# Patient Record
Sex: Female | Born: 1980 | Race: Black or African American | Hispanic: No | Marital: Single | Smoking: Current every day smoker
Health system: Southern US, Community
[De-identification: ages and names within clinical notes are randomized; demographics above are authoritative.]

## PROBLEM LIST (undated history)

## (undated) DIAGNOSIS — F419 Anxiety disorder, unspecified: Secondary | ICD-10-CM

## (undated) HISTORY — PX: KNEE SURGERY: SHX244

## (undated) HISTORY — DX: Anxiety disorder, unspecified: F41.9

---
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
## 2003-01-24 ENCOUNTER — Encounter: Payer: Self-pay | Admitting: Obstetrics & Gynecology

## 2003-01-24 ENCOUNTER — Encounter: Admission: RE | Admit: 2003-01-24 | Discharge: 2003-01-24 | Payer: Self-pay | Admitting: Obstetrics & Gynecology

## 2005-01-25 ENCOUNTER — Emergency Department (HOSPITAL_COMMUNITY): Admission: EM | Admit: 2005-01-25 | Discharge: 2005-01-25 | Payer: Self-pay | Admitting: Emergency Medicine

## 2005-02-19 ENCOUNTER — Emergency Department (HOSPITAL_COMMUNITY): Admission: EM | Admit: 2005-02-19 | Discharge: 2005-02-19 | Payer: Self-pay | Admitting: Emergency Medicine

## 2005-08-04 ENCOUNTER — Emergency Department (HOSPITAL_COMMUNITY): Admission: EM | Admit: 2005-08-04 | Discharge: 2005-08-04 | Payer: Self-pay | Admitting: Emergency Medicine

## 2006-02-16 ENCOUNTER — Emergency Department (HOSPITAL_COMMUNITY): Admission: EM | Admit: 2006-02-16 | Discharge: 2006-02-16 | Payer: Self-pay | Admitting: Emergency Medicine

## 2006-11-28 ENCOUNTER — Emergency Department (HOSPITAL_COMMUNITY): Admission: EM | Admit: 2006-11-28 | Discharge: 2006-11-28 | Payer: Self-pay | Admitting: Emergency Medicine

## 2006-12-15 ENCOUNTER — Emergency Department (HOSPITAL_COMMUNITY): Admission: EM | Admit: 2006-12-15 | Discharge: 2006-12-15 | Payer: Self-pay | Admitting: Emergency Medicine

## 2007-10-15 ENCOUNTER — Emergency Department (HOSPITAL_COMMUNITY): Admission: EM | Admit: 2007-10-15 | Discharge: 2007-10-15 | Payer: Self-pay | Admitting: Emergency Medicine

## 2008-06-26 IMAGING — CR DG RIBS W/ CHEST 3+V*R*
3 series · 3 of 3 positions shown · non-contrast
Comparison: Chest x-ray 02/16/06.

CLINICAL DATA: Pain posterior right lower ribs ? no known injury.
 CHEST WITH RIGHT RIBS ? 3 VIEW:

[view not recorded (1 of 3)]
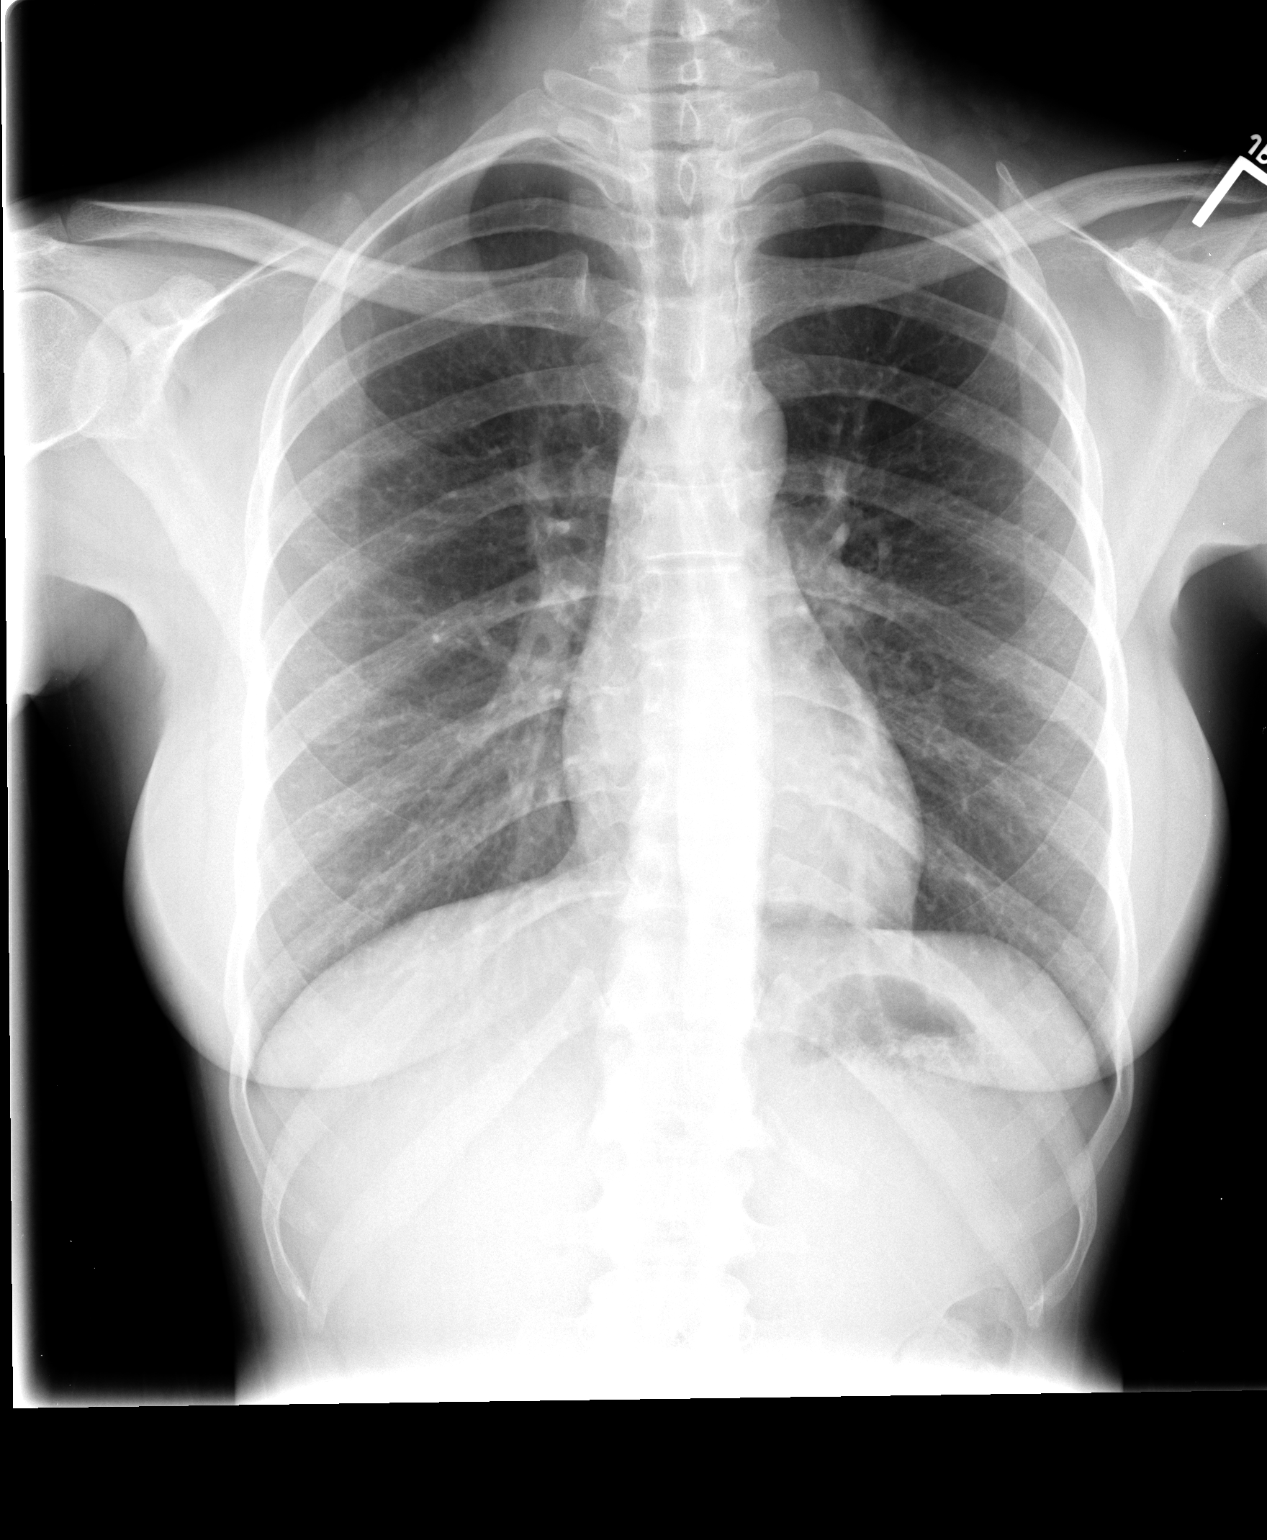

[view not recorded (2 of 3)]
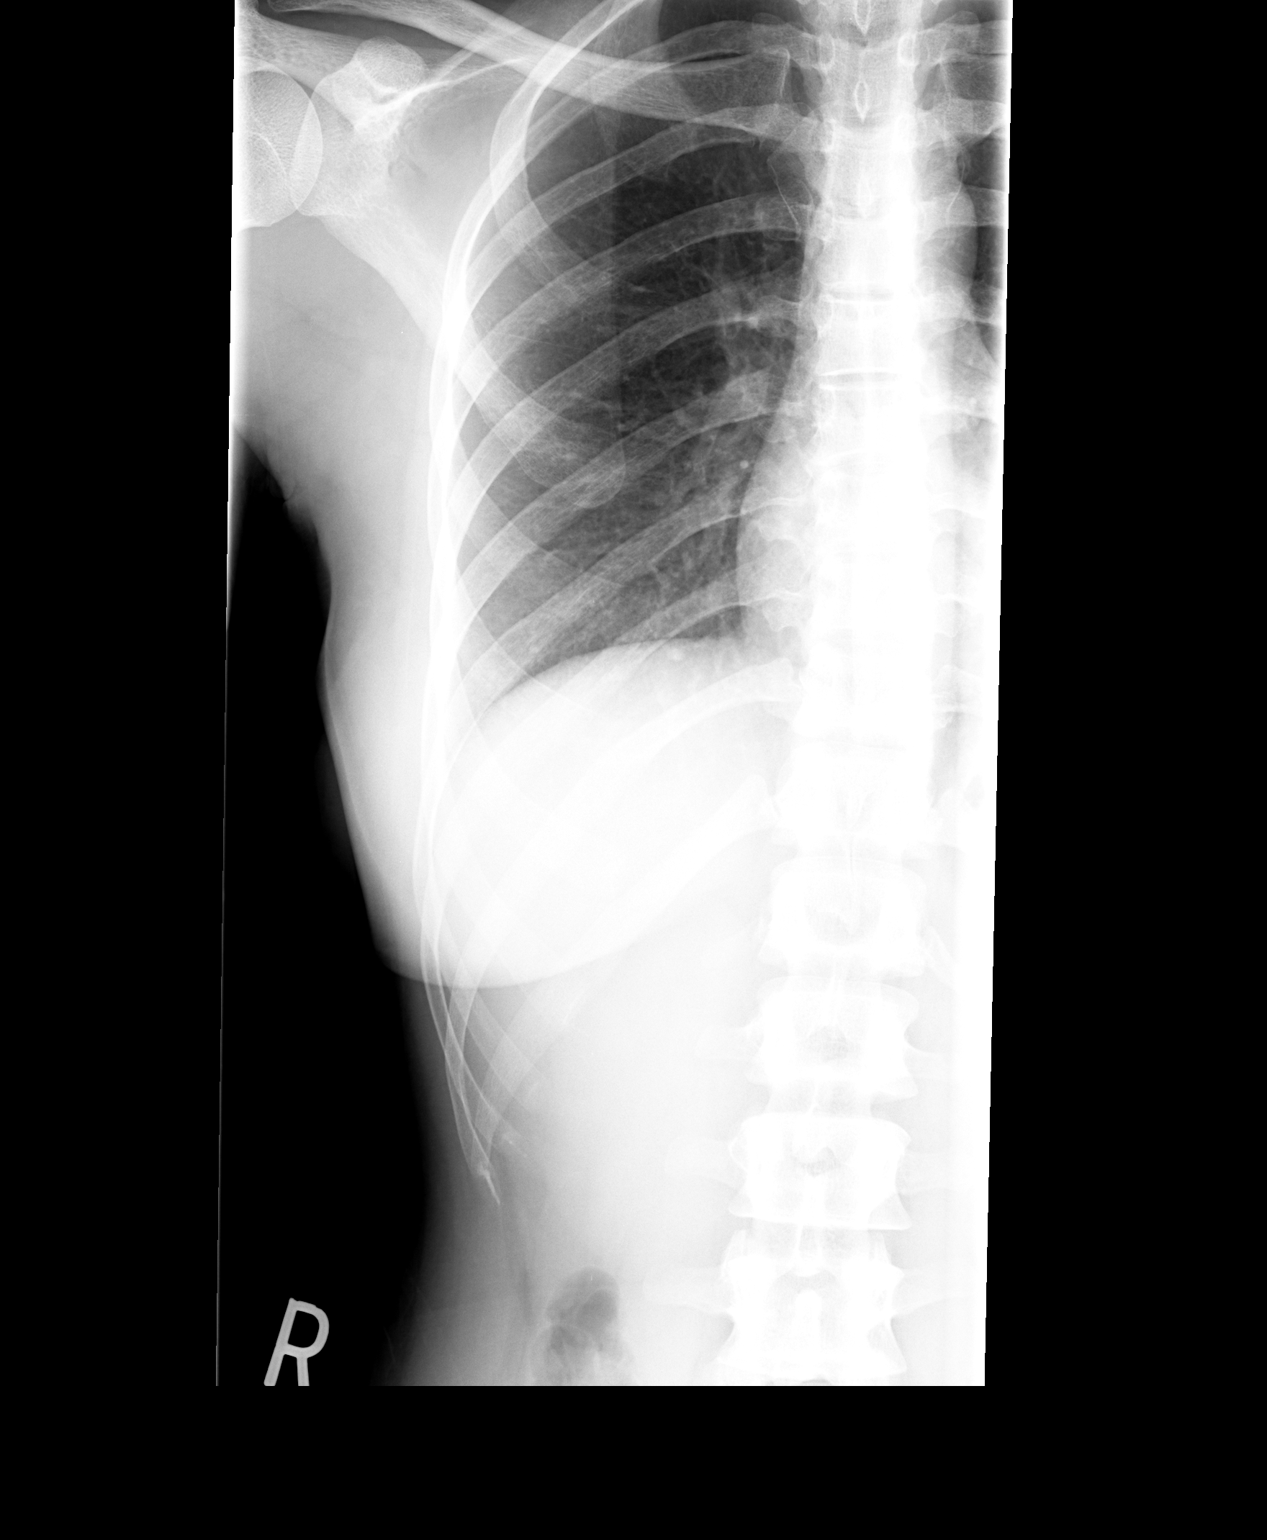

[view not recorded (3 of 3)]
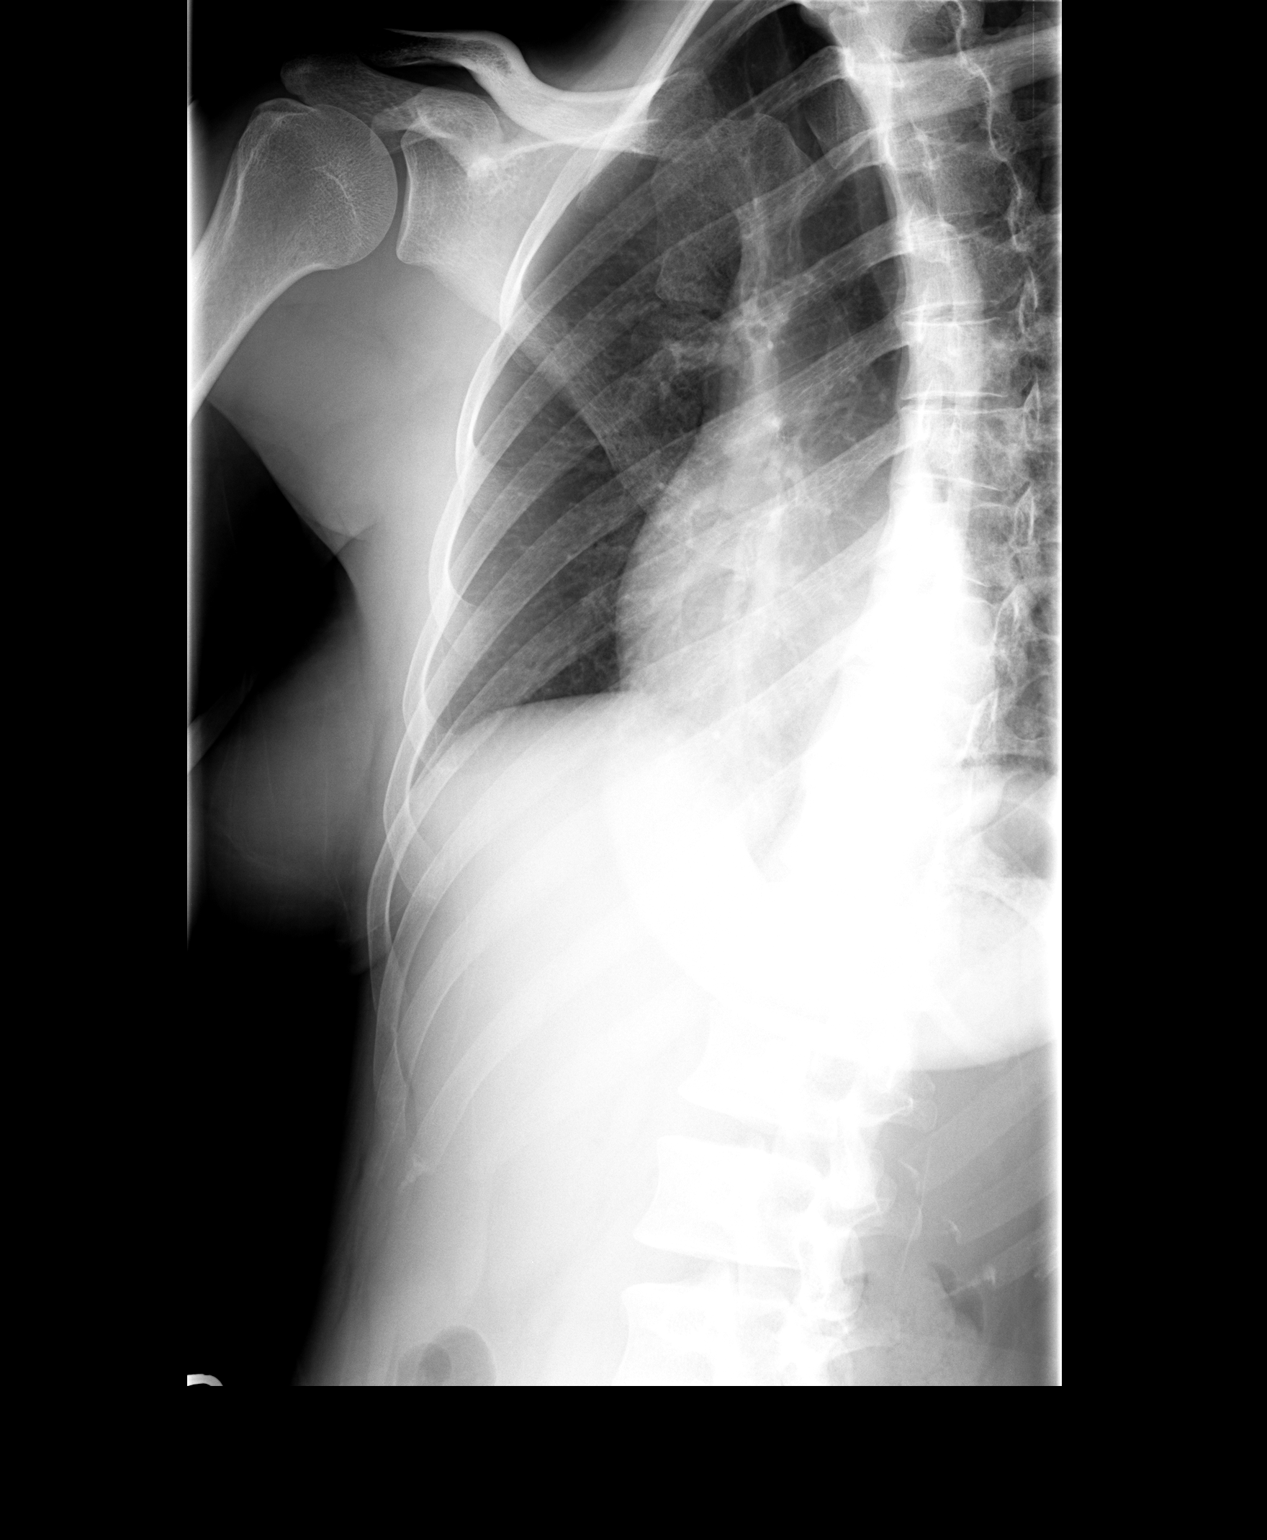

[3 of 3 positions shown; findings below may reference images not displayed]

FINDINGS: No rib fractures. No pneumothorax. No active cardiopulmonary disease.
IMPRESSION: No acute or significant findings.

## 2008-07-22 ENCOUNTER — Emergency Department (HOSPITAL_COMMUNITY): Admission: EM | Admit: 2008-07-22 | Discharge: 2008-07-22 | Payer: Self-pay | Admitting: Emergency Medicine

## 2009-08-12 ENCOUNTER — Emergency Department (HOSPITAL_COMMUNITY): Admission: EM | Admit: 2009-08-12 | Discharge: 2009-08-12 | Payer: Self-pay | Admitting: Emergency Medicine

## 2011-03-10 ENCOUNTER — Emergency Department (HOSPITAL_COMMUNITY)
Admission: EM | Admit: 2011-03-10 | Discharge: 2011-03-10 | Disposition: A | Discharge status: Home / Self Care | Payer: Self-pay | Attending: Emergency Medicine | Admitting: Emergency Medicine

## 2011-03-10 DIAGNOSIS — R21 Rash and other nonspecific skin eruption: Secondary | ICD-10-CM | POA: Insufficient documentation

## 2011-11-26 ENCOUNTER — Encounter (HOSPITAL_COMMUNITY): Payer: Self-pay | Admitting: Emergency Medicine

## 2011-11-26 ENCOUNTER — Emergency Department (HOSPITAL_COMMUNITY)
Admission: EM | Admit: 2011-11-26 | Discharge: 2011-11-26 | Disposition: A | Discharge status: Home / Self Care | Payer: Self-pay | Attending: Emergency Medicine | Admitting: Emergency Medicine

## 2011-11-26 DIAGNOSIS — K089 Disorder of teeth and supporting structures, unspecified: Secondary | ICD-10-CM | POA: Insufficient documentation

## 2011-11-26 DIAGNOSIS — K0889 Other specified disorders of teeth and supporting structures: Secondary | ICD-10-CM

## 2011-11-26 DIAGNOSIS — J45909 Unspecified asthma, uncomplicated: Secondary | ICD-10-CM | POA: Insufficient documentation

## 2011-11-26 DIAGNOSIS — Z79899 Other long term (current) drug therapy: Secondary | ICD-10-CM | POA: Insufficient documentation

## 2011-11-26 DIAGNOSIS — R220 Localized swelling, mass and lump, head: Secondary | ICD-10-CM | POA: Insufficient documentation

## 2011-11-26 DIAGNOSIS — F17200 Nicotine dependence, unspecified, uncomplicated: Secondary | ICD-10-CM | POA: Insufficient documentation

## 2011-11-26 DIAGNOSIS — R221 Localized swelling, mass and lump, neck: Secondary | ICD-10-CM | POA: Insufficient documentation

## 2011-11-26 MED ORDER — PENICILLIN V POTASSIUM 500 MG PO TABS: 500.0000 mg | ORAL_TABLET | Freq: Four times a day (QID) | ORAL | Status: AC

## 2011-11-26 MED ORDER — OXYCODONE-ACETAMINOPHEN 5-325 MG PO TABS: 1.0000 | ORAL_TABLET | Freq: Four times a day (QID) | ORAL | Status: AC | PRN

## 2011-11-26 NOTE — ED Notes (Signed)
Pt c/o wisdom tooth pain x 1 week. Pt reports all 4 wisdom teeth seem to be coming in.

## 2011-11-26 NOTE — ED Provider Notes (Signed)
History     CSN: [id number]  Arrival date & time 11/26/11  0865   First MD Initiated Contact with Patient 11/26/11 [phone]      Chief Complaint  Patient presents with  . Dental Pain    (Consider location/radiation/quality/duration/timing/severity/associated sxs/prior treatment) HPI Comments: The patient reports that she has had right lower wisdom tooth pain for the past 1.5 weeks, which is becoming progressively worse.  She reports that she has been feeling the tooth come through her gum.  She has taken Ibuprofen and Advil for the pain, which she reports has only had minimal relief.  She does not have a dentist.    Patient is a 31 y.o. female presenting with tooth pain. The history is provided by the patient.  Dental PainPrimary symptoms do not include dental injury, headaches or fever.  Additional symptoms include: gum swelling and gum tenderness. Additional symptoms do not include: dental sensitivity to temperature, purulent gums, trismus, facial swelling, trouble swallowing, pain with swallowing and drooling.    Past Medical History  Diagnosis Date  . Asthma     Past Surgical History  Procedure Date  . Knee surgery     History reviewed. No pertinent family history.  History  Substance Use Topics  . Smoking status: Current Everyday Smoker  . Smokeless tobacco: Not on file  . Alcohol Use: Yes    OB History    Grav Para Term Preterm Abortions TAB SAB Ect Mult Living                  Review of Systems  Constitutional: Negative for fever and chills.  HENT: Negative for facial swelling, drooling, trouble swallowing, neck pain, neck stiffness and voice change.   Gastrointestinal: Negative for vomiting.  Neurological: Negative for headaches.    Allergies  Darvocet  Home Medications   Current Outpatient Rx  Name Route Sig Dispense Refill  . CYCLOBENZAPRINE HCL 5 MG PO TABS Oral Take 5 mg by mouth 3 (three) times daily as needed.    . IBUPROFEN 200 MG PO TABS Oral  Take 200 mg by mouth every 6 (six) hours as needed. For pain.      BP 112/79  Pulse 72  Temp(Src) 98.8 F (37.1 C) (Oral)  Resp 18  SpO2 100%  LMP 11/03/2011  Physical Exam  Nursing note and vitals reviewed. Constitutional: She is oriented to person, place, and time. She appears well-developed and well-nourished. No distress.  HENT:  Head: Normocephalic and atraumatic. No trismus in the jaw.  Mouth/Throat: Uvula is midline, oropharynx is clear and moist and mucous membranes are normal. Abnormal dentition. No dental abscesses or uvula swelling. No oropharyngeal exudate, posterior oropharyngeal edema, posterior oropharyngeal erythema or tonsillar abscesses.       Right lower wisdom tooth beginning to protrude through the gum. Pt able to open and close mouth with out difficulty. Airway intact. Uvula midline. Mild gingival swelling with tenderness over affected area, but no fluctuance. No swelling or tenderness of submental and submandibular regions.  Eyes: Conjunctivae and EOM are normal.  Neck: Normal range of motion and full passive range of motion without pain. Neck supple.  Cardiovascular: Normal rate and regular rhythm.   Pulmonary/Chest: Effort normal and breath sounds normal. No stridor. No respiratory distress. She has no wheezes.  Musculoskeletal: Normal range of motion.  Lymphadenopathy:       Head (right side): No submental, no submandibular, no tonsillar, no preauricular and no posterior auricular adenopathy present.  Head (left side): No submental, no submandibular, no tonsillar, no preauricular and no posterior auricular adenopathy present.    She has no cervical adenopathy.  Neurological: She is alert and oriented to person, place, and time.  Skin: Skin is warm and dry. No rash noted. She is not diaphoretic.    ED Course  Procedures (including critical care time)  Labs Reviewed - No data to display No results found.   No diagnosis found.    MDM  Patient  with toothache.  No gross abscess.  Wisdom tooth coming through the gum.   Exam unconcerning for Ludwig's angina or spread of infection.  Will treat with penicillin and pain medicine.  Urged patient to follow-up with oral surgery         Magnus Sinning, PA-C 11/27/11 2335

## 2011-11-26 NOTE — Discharge Instructions (Signed)
You have a dental pain. Use the resource guide listed below to help you find a dentist if you do not already have one to followup with. It is very important that you get evaluated by a dentist as soon as possible. Call tomorrow to schedule an appointment. Use your pain medication as prescribed and do not operate heavy machinery while on pain medication. Note that your pain medication contains acetaminophen (Tylenol) & its is not reccommended that you use additional acetaminophen (Tylenol) while taking this medication. Take your full course of antibiotics. Read the instructions below. ° °Eat a soft or liquid diet and rinse your mouth out after meals with warm water. You should see a dentist or return here at once if you have increased swelling, increased pain or uncontrolled bleeding from the site of your injury. ° ° °SEEK MEDICAL CARE IF:  °· You have increased pain not controlled with medicines.  °· You have swelling around your tooth, in your face or neck.  °· You have bleeding which starts, continues, or gets worse.  °· You have a fever >101 °· If you are unable to open your mouth ° °RESOURCE GUIDE ° °Dental Problems ° °Patients with Medicaid: °[facility]                     [facility] °[address]                                           [address] °Phone:  [phone]                                                  Phone:  [phone] ° °If unable to pay or uninsured, contact:  [facility] or [facility] to become qualified for the adult dental clinic. ° °Chronic Pain Problems °Contact [facility]  [phone] °Patients need to be referred by their primary care doctor. ° °Insufficient Money for Medicine °Contact [employer] Way:  call "211" or [facility] 271-5999. ° °No Primary Care Doctor °Call [facility]  832-8000 °Other agencies that provide inexpensive medical care °   [facility]  [phone] °   [facility]  [phone] °   [facility]  [phone] °   [facility] Clinic  832-4777 °   [facility]  [phone] °   [facility]  [phone] ° °Psychological Services °[facility]  [phone] °[facility]  [phone] °[facility]   800 853-5163 (emergency services 641-4993) ° °Substance Abuse Resources °[facility]  336-882-2125 °[facility] 336-784-9470 °[facility] 336-285-9073 °[facility] 336-845-3988 °Residential & Outpatient Substance Abuse Program  800-659-3381 ° °Abuse/Neglect °[place] [facility] (336) 641-3795 °[place] [facility] Hotline 800-378-5315 (After Hours) ° °Emergency Shelter °[facility] (336) 271-5985 ° °Maternity Homes °[facility] (336) 275-9566 °[facility] (704) 372-4663 ° °MRSA Hotline #:   832-7006 ° ° ° °[place] Resources ° °[facility] [place]     [employer]                            [facility] [address] [place]                       [address]      [address]  [place]                                                [place]                            [place] Phone:  [phone]                                   Phone:  [phone]                 Phone:  [phone]  [facility] Phone:  [phone]  [place] Child Abuse Hotline (336) 342-1394 (336) 342-3537 (After Hours)    

## 2011-11-29 NOTE — ED Provider Notes (Signed)
Medical screening examination/treatment/procedure(s) were performed by non-physician practitioner and as supervising physician I was immediately available for consultation/collaboration.   Issiah Huffaker, MD 11/29/11 2321 

## 2011-12-23 ENCOUNTER — Emergency Department (HOSPITAL_COMMUNITY)
Admission: EM | Admit: 2011-12-23 | Discharge: 2011-12-23 | Disposition: A | Discharge status: Home / Self Care | Payer: Self-pay | Attending: Emergency Medicine | Admitting: Emergency Medicine

## 2011-12-23 ENCOUNTER — Encounter (HOSPITAL_COMMUNITY): Payer: Self-pay | Admitting: *Deleted

## 2011-12-23 DIAGNOSIS — F17200 Nicotine dependence, unspecified, uncomplicated: Secondary | ICD-10-CM | POA: Insufficient documentation

## 2011-12-23 DIAGNOSIS — J45909 Unspecified asthma, uncomplicated: Secondary | ICD-10-CM | POA: Insufficient documentation

## 2011-12-23 DIAGNOSIS — K0889 Other specified disorders of teeth and supporting structures: Secondary | ICD-10-CM

## 2011-12-23 DIAGNOSIS — K089 Disorder of teeth and supporting structures, unspecified: Secondary | ICD-10-CM | POA: Insufficient documentation

## 2011-12-23 DIAGNOSIS — K029 Dental caries, unspecified: Secondary | ICD-10-CM | POA: Insufficient documentation

## 2011-12-23 MED ORDER — PENICILLIN V POTASSIUM 500 MG PO TABS: 500.0000 mg | ORAL_TABLET | Freq: Three times a day (TID) | ORAL | Status: AC

## 2011-12-23 MED ORDER — OXYCODONE-ACETAMINOPHEN 5-325 MG PO TABS
1.0000 | ORAL_TABLET | Freq: Once | ORAL | Status: AC
  Administered 2011-12-23: 1 via ORAL
  Filled 2011-12-23: qty 1

## 2011-12-23 MED ORDER — OXYCODONE-ACETAMINOPHEN 5-325 MG PO TABS: 1.0000 | ORAL_TABLET | Freq: Four times a day (QID) | ORAL | Status: AC | PRN

## 2011-12-23 NOTE — ED Provider Notes (Signed)
History     CSN: [id number]  Arrival date & time 12/23/11  2051   First MD Initiated Contact with Patient 12/23/11 2239      Chief Complaint  Patient presents with  . Dental Pain    (Consider location/radiation/quality/duration/timing/severity/associated sxs/prior treatment) HPI  Patient presents to the emergency department with a dental complaint. Symptoms began 3 days ago. The patient has tried to alleviate pain with Ibuprofen.  Pain rated at a 10/10, characterized as throbbing in nature and located left lower molar. Patient denies fever, night sweats, chills, difficulty swallowing or opening mouth, SOB, nuchal rigidity or decreased ROM of neck.  Patient does not have a dentist and requests a resource guide at discharge.   Past Medical History  Diagnosis Date  . Asthma     Past Surgical History  Procedure Date  . Knee surgery     History reviewed. No pertinent family history.  History  Substance Use Topics  . Smoking status: Current Everyday Smoker  . Smokeless tobacco: Not on file  . Alcohol Use: Yes    OB History    Grav Para Term Preterm Abortions TAB SAB Ect Mult Living                  Review of Systems   HEENT: denies blurry vision or change in hearing PULMONARY: Denies difficulty breathing and SOB CARDIAC: denies chest pain or heart palpitations MUSCULOSKELETAL:  denies being unable to ambulate ABDOMEN AL: denies abdominal pain GU: denies loss of bowel or urinary control NEURO: denies numbness and tingling in extremities   Allergies  Darvocet  Home Medications   Current Outpatient Rx  Name Route Sig Dispense Refill  . IBUPROFEN 200 MG PO TABS Oral Take 200 mg by mouth every 6 (six) hours as needed. For pain.    . OXYCODONE-ACETAMINOPHEN 5-325 MG PO TABS Oral Take 1 tablet by mouth every 6 (six) hours as needed for pain. 10 tablet 0  . PENICILLIN V POTASSIUM 500 MG PO TABS Oral Take 1 tablet (500 mg total) by mouth 3 (three) times daily. 30  tablet 0    BP 122/83  Pulse 69  Temp(Src) 98.3 F (36.8 C) (Oral)  Resp 18  SpO2 99%  LMP 11/29/2011  Physical Exam  Constitutional: She appears well-developed and well-nourished. No distress.  HENT:  Head: Normocephalic and atraumatic. No trismus in the jaw.  Mouth/Throat: Uvula is midline, oropharynx is clear and moist and mucous membranes are normal. Normal dentition. Dental caries (Pts tooth shows no obvious abscess but moderate to severe tenderness to palpation of marked tooth) present. No uvula swelling.    Eyes: Pupils are equal, round, and reactive to light.  Neck: Trachea normal, normal range of motion and full passive range of motion without pain. Neck supple.  Cardiovascular: Normal rate, regular rhythm, normal heart sounds and normal pulses.   Pulmonary/Chest: Effort normal and breath sounds normal. No respiratory distress. Chest wall is not dull to percussion. She exhibits no tenderness, no crepitus, no edema, no deformity and no retraction.  Abdominal: Normal appearance.  Musculoskeletal: Normal range of motion.  Neurological: She is alert. She has normal strength.  Skin: Skin is warm, dry and intact. She is not diaphoretic.  Psychiatric: She has a normal mood and affect. Her speech is normal. Cognition and memory are normal.    ED Course  Procedures (including critical care time)  Labs Reviewed - No data to display No results found.   1. Pain, dental  MDM  Pt given Rx for Percocets 5-325 (10 tabs) and Penicillin. Patient informed that they need to find a dentist and have the tooth pulled or the symptoms may be reoccurring. A Resource guide has been given with dental providers. Patient has been given return to ED precautions.         Dorthula Matas, PA 12/24/11 2237

## 2011-12-23 NOTE — ED Notes (Addendum)
C/o pain r/t wisdom teeth coming in, was here last month for the same, reoccurred 3d ago, comes and goes, pinpoints pain to L upper and lower jaw. Mentions low grade fever and some drainange (Denies: nvd). Last took advil at 1830, no relief.

## 2011-12-23 NOTE — Discharge Instructions (Signed)
Dental Pain A tooth ache may be caused by cavities (tooth decay). Cavities expose the nerve of the tooth to air and hot or cold temperatures. It may come from an infection or abscess (also called a boil or furuncle) around your tooth. It is also often caused by dental caries (tooth decay). This causes the pain you are having. DIAGNOSIS  Your caregiver can diagnose this problem by exam. TREATMENT   If caused by an infection, it may be treated with medications which kill germs (antibiotics) and pain medications as prescribed by your caregiver. Take medications as directed.   Only take over-the-counter or prescription medicines for pain, discomfort, or fever as directed by your caregiver.   Whether the tooth ache today is caused by infection or dental disease, you should see your dentist as soon as possible for further care.  SEEK MEDICAL CARE IF: The exam and treatment you received today has been provided on an emergency basis only. This is not a substitute for complete medical or dental care. If your problem worsens or new problems (symptoms) appear, and you are unable to meet with your dentist, call or return to this location. SEEK IMMEDIATE MEDICAL CARE IF:   You have a fever.   You develop redness and swelling of your face, jaw, or neck.   You are unable to open your mouth.   You have severe pain uncontrolled by pain medicine.  MAKE SURE YOU:   Understand these instructions.   Will watch your condition.   Will get help right away if you are not doing well or get worse.  Document Released: 07/25/2005 Document Revised: 07/14/2011 Document Reviewed: 03/12/2008 Exit[employer] Patient Information 2012 [employer]  RESOURCE GUIDE  Dental Problems  Patients with Medicaid: [facility]                     [facility] [address]                                           [address] Phone:  [phone]                                                   Phone:  [phone]  If unable to pay or uninsured, contact:  [facility] or [facility] to become qualified for the adult dental clinic.  Chronic Pain Problems Contact [facility]  [phone] Patients need to be referred by their primary care doctor.  Insufficient Money for Medicine Contact [employer] Way:  call "211" or [facility] 271-5999.  No Primary Care Doctor Call [facility]  832-8000 Other agencies that provide inexpensive medical care    [facility]  832-8035    [facility]  832-7272    [facility]  [phone]    [facility] Clinic  832-4777    [facility]  [phone]    [facility]  [phone]  Psychological Services [facility]  832-9600 [facility]  [phone] [facility]   800 853-5163 (emergency services 641-4993)  Substance Abuse Resources [facility]  336-882-2125 [facility] 336-784-9470 [facility] 336-285-9073 [facility]   336-845-3988 Residential & Outpatient Substance Abuse Program  800-659-3381  Abuse/Neglect [place] Child [facility] Hotline (336) 641-3795 [place] [facility] Hotline 800-378-5315 (After Hours)  Emergency Shelter [facility] (336) 271-5985  Maternity Homes [facility] (336) 275-9566 [facility] (704) 372-4663  MRSA Hotline #:   832-7006    [place] Resources  [facility] [place]     [employer]                          [facility] [address] [place]                       [address]      [address]  [place]                                                [place]                            [place] Phone:  [phone]                                   Phone:  [phone]                 Phone:  [phone]  [facility] Phone:   [phone]  [place] Child Abuse Hotline (336) 342-1394 (336) 342-3537 (After Hours)   

## 2011-12-25 NOTE — ED Provider Notes (Signed)
Medical screening examination/treatment/procedure(s) were performed by non-physician practitioner and as supervising physician I was immediately available for consultation/collaboration.   Gerhard Munch, MD 12/25/11 1436

## 2012-11-23 ENCOUNTER — Emergency Department (HOSPITAL_COMMUNITY)
Admission: EM | Admit: 2012-11-23 | Discharge: 2012-11-23 | Disposition: A | Discharge status: Home / Self Care | Payer: 59 | Attending: Emergency Medicine | Admitting: Emergency Medicine

## 2012-11-23 ENCOUNTER — Encounter (HOSPITAL_COMMUNITY): Payer: Self-pay | Admitting: Emergency Medicine

## 2012-11-23 DIAGNOSIS — L299 Pruritus, unspecified: Secondary | ICD-10-CM | POA: Insufficient documentation

## 2012-11-23 DIAGNOSIS — F17200 Nicotine dependence, unspecified, uncomplicated: Secondary | ICD-10-CM | POA: Insufficient documentation

## 2012-11-23 DIAGNOSIS — J45909 Unspecified asthma, uncomplicated: Secondary | ICD-10-CM | POA: Insufficient documentation

## 2012-11-23 DIAGNOSIS — R21 Rash and other nonspecific skin eruption: Secondary | ICD-10-CM

## 2012-11-23 DIAGNOSIS — Z79899 Other long term (current) drug therapy: Secondary | ICD-10-CM | POA: Insufficient documentation

## 2012-11-23 MED ORDER — PERMETHRIN 5 % EX CREA: TOPICAL_CREAM | CUTANEOUS | Status: AC

## 2012-11-23 MED ORDER — HYDROCORTISONE 1 % EX CREA: TOPICAL_CREAM | Freq: Two times a day (BID) | CUTANEOUS | Status: DC

## 2012-11-23 NOTE — ED Provider Notes (Signed)
History     CSN: [id number]  Arrival date & time 11/23/12  1135   First MD Initiated Contact with Patient 11/23/12 1154      No chief complaint on file.   (Consider location/radiation/quality/duration/timing/severity/associated sxs/prior treatment) HPI Comments: Patient presents emergency department with chief complaint of rash. She believes that it is associated with taking Klonopin. She has several papules on the upper thighs. She states that there is very itchy. She states that she then picks the scab, and they get worse. She has not tried anything for her symptoms. Nothing makes her symptoms better or worse. She denies sleeping anywhere new.  The history is provided by the patient. No language interpreter was used.    Past Medical History  Diagnosis Date  . Asthma     Past Surgical History  Procedure Laterality Date  . Knee surgery      No family history on file.  History  Substance Use Topics  . Smoking status: Current Every Day Smoker  . Smokeless tobacco: Not on file  . Alcohol Use: Yes    OB History   Grav Para Term Preterm Abortions TAB SAB Ect Mult Living                  Review of Systems  All other systems reviewed and are negative.    Allergies  Darvocet  Home Medications   Current Outpatient Rx  Name  Route  Sig  Dispense  Refill  . albuterol (PROVENTIL HFA;VENTOLIN HFA) 108 (90 BASE) MCG/ACT inhaler   Inhalation   Inhale 2 puffs into the lungs every 6 (six) hours as needed for wheezing.         . bacitracin ointment   Topical   Apply 1 application topically 2 (two) times daily as needed (itching on legs).         . clonazePAM (KLONOPIN) 1 MG tablet   Oral   Take 1 mg by mouth 2 (two) times daily as needed for anxiety.         . Multiple Vitamins-Minerals (ADULT GUMMY) CHEW   Oral   Chew 1 capsule by mouth daily.         . hydrocortisone cream 1 %   Topical   Apply topically 2 (two) times daily. Do not apply to face   15  g   1   . permethrin (ELIMITE) 5 % cream      Apply to entire body other than face - let sit for 14 hours then wash off, may repeat in 1 week if still having symptoms   60 g   1     BP 124/73  Pulse 88  Temp(Src) 98.4 F (36.9 C) (Oral)  Resp 16  SpO2 96%  Physical Exam  Nursing note and vitals reviewed. Constitutional: She is oriented to person, place, and time. She appears well-developed and well-nourished.  HENT:  Head: Normocephalic and atraumatic.  Eyes: Conjunctivae and EOM are normal.  Neck: Normal range of motion.  Cardiovascular: Normal rate.   Pulmonary/Chest: Effort normal.  Abdominal: She exhibits no distension.  Musculoskeletal: Normal range of motion.  Neurological: She is alert and oriented to person, place, and time.  Skin: Skin is dry.  Several small lesions on the upper thighs, Characteristic of bed bug bites, no surrounding cellulitis or inflammation, no drainage, or signs of infection  Psychiatric: She has a normal mood and affect. Her behavior is normal. Judgment and thought content normal.    ED  Course  Procedures (including critical care time)  Labs Reviewed - No data to display No results found.   1. Rash       MDM  Patient with a rash. She believes it to be due to drug reaction. Looks more like insect bites, likely bed bug bites. She states it is very very itchy, cannot rule out scabies as well. Will treat with permethrin hydrocortisone cream. Patient understands and agrees to plan. She is stable and ready for discharge.        Roxy Horseman, PA-C 11/23/12 1228

## 2012-11-23 NOTE — ED Notes (Signed)
Pt given 2 prescriptions. Pt upset that she did not get any pain medication for the rash. Pt walked with mother and this nurse to the d/c window.

## 2012-11-23 NOTE — ED Notes (Signed)
Pt has bumps that itch and are sore over both upper legs and one place on left abdomen. Some are healing and have crusted over with a few new ones. Pt says she " I think it is a reaction from the clonopin that I started taking". Some relief from putting bactatracin on each place.

## 2012-11-24 NOTE — ED Provider Notes (Signed)
Medical screening examination/treatment/procedure(s) were performed by non-physician practitioner and as supervising physician I was immediately available for consultation/collaboration.  Thorsten Climer T Lenvil Swaim, MD 11/24/12 0839 

## 2013-04-24 ENCOUNTER — Ambulatory Visit: Payer: 59 | Attending: Internal Medicine | Admitting: Internal Medicine

## 2013-04-24 VITALS — BP 124/88 | HR 70 | Temp 98.4°F | Resp 16

## 2013-04-24 DIAGNOSIS — M25539 Pain in unspecified wrist: Secondary | ICD-10-CM

## 2013-04-24 DIAGNOSIS — J45909 Unspecified asthma, uncomplicated: Secondary | ICD-10-CM | POA: Insufficient documentation

## 2013-04-24 DIAGNOSIS — M25531 Pain in right wrist: Secondary | ICD-10-CM

## 2013-04-24 DIAGNOSIS — M79609 Pain in unspecified limb: Secondary | ICD-10-CM | POA: Insufficient documentation

## 2013-04-24 DIAGNOSIS — F17200 Nicotine dependence, unspecified, uncomplicated: Secondary | ICD-10-CM | POA: Insufficient documentation

## 2013-04-24 LAB — LIPID PANEL
Cholesterol: 162 mg/dL (ref 0–200)
HDL: 80 mg/dL (ref >39)
Triglycerides: 37 mg/dL (ref <150)

## 2013-04-24 LAB — COMPLETE METABOLIC PANEL WITH GFR
Albumin: 4.8 g/dL (ref 3.5–5.2)
Alkaline Phosphatase: 61 U/L (ref 39–117)
BUN: 14 mg/dL (ref 6–23)
Calcium: 9.9 mg/dL (ref 8.4–10.5)
Chloride: 106 mEq/L (ref 96–112)
GFR, Est Non African American: >89 mL/min
Glucose, Bld: 86 mg/dL (ref 70–99)
Potassium: 4.7 mEq/L (ref 3.5–5.3)

## 2013-04-24 LAB — CBC
Platelets: 252 10*3/uL (ref 150–400)
RDW: 13.7 % (ref 11.5–15.5)
WBC: 6.1 10*3/uL (ref 4.0–10.5)

## 2013-04-24 MED ORDER — ALBUTEROL SULFATE HFA 108 (90 BASE) MCG/ACT IN AERS: 2.0000 | INHALATION_SPRAY | Freq: Four times a day (QID) | RESPIRATORY_TRACT | Status: DC | PRN

## 2013-04-24 NOTE — Progress Notes (Signed)
Patient ID: Allison Foley, female   DOB: 1981/04/22, 32 y.o.   MRN: [id number] Patient Demographics  Allison Foley, is a 32 y.o. female  CSN: [id number]  MRN: [id number]  DOB - 1980/09/10  Outpatient Primary MD for the patient is Jeanann Lewandowsky, MD   With History of -  Past Medical History  Diagnosis Date  . Asthma   . Anxiety       Past Surgical History  Procedure Laterality Date  . Knee surgery      in for   Chief Complaint  Patient presents with  . Asthma     HPI  Allison Foley  is a 32 y.o. female, with past history of chronic intermittent asthma, intermittent pain and discomfort in the right hand she describes as her joints swell up and hurt from time to time, intermittent panic attacks, history of smoking, comes it to establish care, she has no subjective complaints, currently her right hand is not bothering her, she has no wheezing or shortness of breath, currently not suicidal homicidal or anxious.    Review of Systems    In addition to the HPI above,   No Fever-chills, No Headache, No changes with Vision or hearing, No problems swallowing food or Liquids, No Chest pain, Cough or Shortness of Breath, No Abdominal pain, No Nausea or Vommitting, Bowel movements are regular, No Blood in stool or Urine, No dysuria, No new skin rashes or bruises, No new joints pains-aches,  No new weakness, tingling, numbness in any extremity, No recent weight gain or loss, No polyuria, polydypsia or polyphagia, No significant Mental Stressors.  A full 10 point Review of Systems was done, except as stated above, all other Review of Systems were negative.   Social History History  Substance Use Topics  . Smoking status: Current Every Day Smoker  . Smokeless tobacco: Not on file  . Alcohol Use: Yes     Family History Positive for rheumatoid arthritis in mother  Prior to Admission medications   Medication Sig Start Date End Date Taking? Authorizing Provider   albuterol (PROVENTIL HFA;VENTOLIN HFA) 108 (90 BASE) MCG/ACT inhaler Inhale 2 puffs into the lungs every 6 (six) hours as needed for wheezing.    Historical Provider, MD  bacitracin ointment Apply 1 application topically 2 (two) times daily as needed (itching on legs).    Historical Provider, MD  clonazePAM (KLONOPIN) 1 MG tablet Take 1 mg by mouth 2 (two) times daily as needed for anxiety.    Historical Provider, MD  hydrocortisone cream 1 % Apply topically 2 (two) times daily. Do not apply to face 11/23/12   Roxy Horseman, PA-C  Multiple Vitamins-Minerals (ADULT GUMMY) CHEW Chew 1 capsule by mouth daily.    Historical Provider, MD  permethrin (ELIMITE) 5 % cream Apply to entire body other than face - let sit for 14 hours then wash off, may repeat in 1 week if still having symptoms 11/23/12   Roxy Horseman, PA-C    Allergies  Allergen Reactions  . Darvocet [Propoxyphene-Acetaminophen] Other (See Comments)    nosebleeds    Physical Exam  Vitals  Blood pressure 124/88, pulse 70, temperature 98.4 F (36.9 C), resp. rate 16, SpO2 100.00%.   1. General Young African American female sitting on clinic examination table in no apparent distress,   2. Normal affect and insight, Not Suicidal or Homicidal, Awake Alert, Oriented X 3.  3. No F.N deficits, ALL C.Nerves Intact, Strength 5/5 all 4 extremities, Sensation intact all 4  extremities, Plantars down going.  4. Ears and Eyes appear Normal, Conjunctivae clear, PERRLA. Moist Oral Mucosa.  5. Supple Neck, No JVD, No cervical lymphadenopathy appriciated, No Carotid Bruits.  6. Symmetrical Chest wall movement, Good air movement bilaterally, CTAB.  7. RRR, No Gallops, Rubs or Murmurs, No Parasternal Heave.  8. Positive Bowel Sounds, Abdomen Soft, Non tender, No organomegaly appriciated,No rebound -guarding or rigidity.  9.  No Cyanosis, Normal Skin Turgor, No Skin Rash or Bruise.  10. Good muscle tone,  joints appear normal , no  effusions, Normal ROM.  11. No Palpable Lymph Nodes in Neck or Axillae     Data Review  CBC No results found for this basename: WBC, HGB, HCT, PLT, MCV, MCH, MCHC, RDW, NEUTRABS, LYMPHSABS, MONOABS, EOSABS, BASOSABS, BANDABS, BANDSABD,  in the last 168 hours ------------------------------------------------------------------------------------------------------------------  Chemistries  No results found for this basename: NA, K, CL, CO2, GLUCOSE, BUN, CREATININE, GFRCGP, CALCIUM, MG, AST, ALT, ALKPHOS, BILITOT,  in the last 168 hours ------------------------------------------------------------------------------------------------------------------ CrCl is unknown because no creatinine reading has been taken and the patient has no height on file. ------------------------------------------------------------------------------------------------------------------ No results found for this basename: TSH, T4TOTAL, FREET3, T3FREE, THYROIDAB,  in the last 72 hours   Coagulation profile No results found for this basename: INR, PROTIME,  in the last 168 hours ------------------------------------------------------------------------------------------------------------------- No results found for this basename: DDIMER,  in the last 72 hours -------------------------------------------------------------------------------------------------------------------  Cardiac Enzymes No results found for this basename: CK, CKMB, TROPONINI, MYOGLOBIN,  in the last 168 hours ------------------------------------------------------------------------------------------------------------------ No components found with this basename: POCBNP,    ---------------------------------------------------------------------------------------------------------------  Urinalysis No results found for this basename: colorurine, appearanceur, labspec, phurine, glucoseu, hgbur, bilirubinur, ketonesur, proteinur, urobilinogen, nitrite,  leukocytesur       Assessment and plan  1. Right hand pain and swelling which is intermittent. Family history of rheumatoid arthritis, currently unremarkable and without any pain or discomfort, ordered right hand x-ray along with rheumatoid factor and anti-CCP antibody.   2. Smoking. Counseled to quit   3. Chronic asthma stable will give her rescue inhaler prescription   4. Intermittent panic attacks referred to psychiatry, she has Klonopin at home. May need SSRIs.   5. Patient is currently not sexually active with a female partner for the last 79yrs, however never had a Pap smear. She is now sexually active with a female partner. Will order a baseline Pap smear as she never had it and was sexually active with female partners in the past.    Routine health maintenance.  Screening labs. CBC, CMP, TSH, A1c, lipid panel, rheumatoid factor, anti-CCP anti-body all ordered  Pap smear - referral made   Immunizations she'll refuse tetanus and influenza shot        Leroy Sea M.D on 04/24/2013 at 9:43 AM

## 2013-04-24 NOTE — Progress Notes (Signed)
Patient here to establish care Has history of asthma Needs script for inhaler

## 2013-05-16 ENCOUNTER — Ambulatory Visit: Payer: 59

## 2013-07-16 ENCOUNTER — Encounter (HOSPITAL_COMMUNITY): Payer: Self-pay | Admitting: Emergency Medicine

## 2013-07-16 ENCOUNTER — Emergency Department (INDEPENDENT_AMBULATORY_CARE_PROVIDER_SITE_OTHER)
Admission: EM | Admit: 2013-07-16 | Discharge: 2013-07-16 | Disposition: A | Discharge status: Home / Self Care | Payer: 59 | Source: Home / Self Care

## 2013-07-16 DIAGNOSIS — K089 Disorder of teeth and supporting structures, unspecified: Secondary | ICD-10-CM

## 2013-07-16 DIAGNOSIS — G8929 Other chronic pain: Secondary | ICD-10-CM

## 2013-07-16 MED ORDER — CLINDAMYCIN HCL 300 MG PO CAPS
300.0000 mg | ORAL_CAPSULE | Freq: Three times a day (TID) | ORAL | Status: AC
Start: 2013-07-16 — End: ?

## 2013-07-16 MED ORDER — HYDROCODONE-ACETAMINOPHEN 5-325 MG PO TABS: 1.0000 | ORAL_TABLET | Freq: Every evening | ORAL | Status: DC | PRN

## 2013-07-16 NOTE — ED Notes (Signed)
Pt  Reports  Symptoms  Of   Toothache      Both  Sides       -  She reports  The  Pain    Has  Been  For  Almost  A  Year            -       She  Reports  She  Has  Problems  With  Her  Wisdom teeth          She  Reports  The  Pain  Got  Worse  Last week      She  Is  Sitting  Upright  On     Exam table  Speaking  In  Complete  sentances

## 2013-07-16 NOTE — ED Provider Notes (Signed)
Allison Foley is a 32 y.o. female who presents to Urgent Care today for dental issue. Gums and wisdom teeth in back making it flare, pushing teeth in front inward. Saw dentist Sept and got xray that showed wisdom teeth growing into jaw so could not pull them like regular teeth. Holds face in sleep. Wakes her up at night/keeps her from sleeping. Left side hurting worse because chewing mostly on that side (because nerve exposed on right). No pus but at times with gargle, it will bleed. Unsure if have had fevers or chills; has woken up in a sweat. While issue has been going on for 1.5 years, relapsing and remitting, most recent flare x 1.5 weeks and making other teeth hurt. Unsure if swollen. Has tried oragel but hard to see. Oragel helps a little. Advil works a little but wears off soon.  Pt's current dental hygiene includes: Brushing q morning, not nighttime. Uses mouthwash 1-2 x per day because food gets stuck. Not flossing.  Past Medical History  Diagnosis Date  . Asthma   . Anxiety    History  Substance Use Topics  . Smoking status: Current Every Day Smoker  . Smokeless tobacco: Not on file  . Alcohol Use: Yes   ROS as above Medications reviewed. No current facility-administered medications for this encounter.   Current Outpatient Prescriptions  Medication Sig Dispense Refill  . albuterol (PROVENTIL HFA;VENTOLIN HFA) 108 (90 BASE) MCG/ACT inhaler Inhale 2 puffs into the lungs every 6 (six) hours as needed for wheezing.      Marland Kitchen albuterol (PROVENTIL HFA;VENTOLIN HFA) 108 (90 BASE) MCG/ACT inhaler Inhale 2 puffs into the lungs every 6 (six) hours as needed for wheezing.  1 Inhaler  2  . bacitracin ointment Apply 1 application topically 2 (two) times daily as needed (itching on legs).      . clonazePAM (KLONOPIN) 1 MG tablet Take 1 mg by mouth 2 (two) times daily as needed for anxiety.      . hydrocortisone cream 1 % Apply topically 2 (two) times daily. Do not apply to face  15 g  1  .  Multiple Vitamins-Minerals (ADULT GUMMY) CHEW Chew 1 capsule by mouth daily.      . permethrin (ELIMITE) 5 % cream Apply to entire body other than face - let sit for 14 hours then wash off, may repeat in 1 week if still having symptoms  60 g  1    Exam:  BP 123/80  Pulse 85  Temp(Src) 97.8 F (36.6 C) (Oral)  Resp 16  SpO2 100%  LMP 06/17/2013 Gen: Well NAD HEENT: EOMI,  MMM, gums mildly macerated/gingivitis at gumline, posterior gums swelling over posterior upper and lower bilateral molars, no purulence or foul odor Lungs: Normal work of breathing. Skin: Warm and well-perfused.  No results found for this or any previous visit (from the past 24 hour(s)). No results found.  Periodontal Exam  Dental injection: Consent obtained Topical numbing medicine applied to the base of the left posterior inferior tooth 1.8 mL of Marcaine and epinephrine were injected into the base of the tooth at the junction of the gum and cheek achieving good anesthesia. Patient tolerated procedure well.  Assessment and Plan: 32 y.o. female with chronic dental pain. Seen by dentist who recommended seeing oral surgeon due to teeth "grown into jaw". Gingivitis. No fever or sign of abscess at this time. - Dosed numbing dental injection today per pt preference. - Will dose clindamycin x 20 day course. -  Will send home with 15 of norco to use daily PRN at nighttime (to help with sleep). - Strongly recommended improving dental hygiene to brushing twice daily, flossing prior to bed, and swishing with mouthwash before bed. - Provided information on emergency dental clinic. Recommended following up with dentist/oral surgeon.   Leona Singleton, MD   Leona Singleton, MD 07/16/13 [phone]

## 2013-07-17 NOTE — ED Provider Notes (Signed)
Medical screening examination/treatment/procedure(s) were performed by a resident physician or non-physician practitioner and as the supervising physician I was immediately available for consultation/collaboration.  Clementeen Graham, MD    Rodolph Bong, MD 07/17/13 [phone]

## 2013-08-14 ENCOUNTER — Encounter: Payer: 59 | Admitting: Obstetrics & Gynecology

## 2014-11-18 ENCOUNTER — Ambulatory Visit (INDEPENDENT_AMBULATORY_CARE_PROVIDER_SITE_OTHER): Payer: 59 | Admitting: Obstetrics and Gynecology

## 2014-11-18 ENCOUNTER — Encounter: Payer: Self-pay | Admitting: Obstetrics and Gynecology

## 2014-11-18 VITALS — BP 101/68 | HR 65 | Temp 98.4°F | Ht 66.0 in | Wt 117.0 lb

## 2014-11-18 DIAGNOSIS — F419 Anxiety disorder, unspecified: Secondary | ICD-10-CM | POA: Insufficient documentation

## 2014-11-18 DIAGNOSIS — Z0000 Encounter for general adult medical examination without abnormal findings: Secondary | ICD-10-CM

## 2014-11-18 DIAGNOSIS — R2991 Unspecified symptoms and signs involving the musculoskeletal system: Secondary | ICD-10-CM

## 2014-11-18 DIAGNOSIS — J45909 Unspecified asthma, uncomplicated: Secondary | ICD-10-CM | POA: Diagnosis not present

## 2014-11-18 DIAGNOSIS — Z7189 Other specified counseling: Secondary | ICD-10-CM | POA: Diagnosis not present

## 2014-11-18 DIAGNOSIS — Z7689 Persons encountering health services in other specified circumstances: Secondary | ICD-10-CM

## 2014-11-18 DIAGNOSIS — M259 Joint disorder, unspecified: Secondary | ICD-10-CM | POA: Diagnosis not present

## 2014-11-18 DIAGNOSIS — Z720 Tobacco use: Secondary | ICD-10-CM

## 2014-11-18 MED ORDER — ALBUTEROL SULFATE HFA 108 (90 BASE) MCG/ACT IN AERS: 2.0000 | INHALATION_SPRAY | RESPIRATORY_TRACT | Status: DC | PRN

## 2014-11-18 NOTE — Patient Instructions (Signed)
[name] it was great to meet you today!  Here are some of the things we discussed today: -I refilled your albuterol -Please schedule an appointment for a physical so I can work up your other medical conditions.   Please schedule a follow-up appointment for for 3-4 weeks for a complete physical.  Thanks for allowing me to be a part of your care! Dr. Doroteo GlassmanPhelps   Generalized Anxiety Disorder Generalized anxiety disorder (GAD) is a mental disorder. It interferes with life functions, including relationships, work, and school. GAD is different from normal anxiety, which everyone experiences at some point in their lives in response to specific life events and activities. Normal anxiety actually helps Allison Foley prepare for and get through these life events and activities. Normal anxiety goes away after the event or activity is over.  GAD causes anxiety that is not necessarily related to specific events or activities. It also causes excess anxiety in proportion to specific events or activities. The anxiety associated with GAD is also difficult to control. GAD can vary from mild to severe. People with severe GAD can have intense waves of anxiety with physical symptoms (panic attacks).  SYMPTOMS The anxiety and worry associated with GAD are difficult to control. This anxiety and worry are related to many life events and activities and also occur more days than not for 6 months or longer. People with GAD also have three or more of the following symptoms (one or more in children):  Restlessness.   Fatigue.  Difficulty concentrating.   Irritability.  Muscle tension.  Difficulty sleeping or unsatisfying sleep. DIAGNOSIS GAD is diagnosed through an assessment by your health care provider. Your health care provider will ask you questions aboutyour mood,physical symptoms, and events in your life. Your health care provider may ask you about your medical history and use of alcohol or drugs, including  prescription medicines. Your health care provider may also do a physical exam and blood tests. Certain medical conditions and the use of certain substances can cause symptoms similar to those associated with GAD. Your health care provider may refer you to a mental health specialist for further evaluation. TREATMENT The following therapies are usually used to treat GAD:   Medication. Antidepressant medication usually is prescribed for long-term daily control. Antianxiety medicines may be added in severe cases, especially when panic attacks occur.   Talk therapy (psychotherapy). Certain types of talk therapy can be helpful in treating GAD by providing support, education, and guidance. A form of talk therapy called cognitive behavioral therapy can teach you healthy ways to think about and react to daily life events and activities.  Stress managementtechniques. These include yoga, meditation, and exercise and can be very helpful when they are practiced regularly. A mental health specialist can help determine which treatment is best for you. Some people see improvement with one therapy. However, other people require a combination of therapies. Document Released: 11/19/2012 Document Revised: 12/09/2013 Document Reviewed: 11/19/2012 [facility] Patient Information 2015 [employer] [place], [place]. This information is not intended to replace advice given to you by your health care provider. Make sure you discuss any questions you have with your health care provider.

## 2014-11-19 ENCOUNTER — Encounter: Payer: Self-pay | Admitting: Obstetrics and Gynecology

## 2014-11-19 DIAGNOSIS — Z0000 Encounter for general adult medical examination without abnormal findings: Secondary | ICD-10-CM | POA: Insufficient documentation

## 2014-11-19 DIAGNOSIS — R2991 Unspecified symptoms and signs involving the musculoskeletal system: Secondary | ICD-10-CM | POA: Insufficient documentation

## 2014-11-19 DIAGNOSIS — Z720 Tobacco use: Secondary | ICD-10-CM | POA: Insufficient documentation

## 2014-11-19 NOTE — Progress Notes (Signed)
    Subjective: Chief Complaint  Patient presents with  . Establish Care    HPI: Allison Foley is a 34 y.o. presenting to clinic today to establish care. She has not been seen by a doctor in years. Patient states she is now wanting to establish care because she is starting to have several different issues with her body as she gets older. Her partner comes to our clinic and that is how patient heard about us. Concerns today include:  "Bad Feet": -patient states she has had "bad" feet for years -she never lets anyone look at her feet  -she is starting to get concerned because feet are starting to cause her pain -she admits that she has not always wore the best shoes  Asthma: -patient stating she has exertional asthma that was diagnosed when she was in her 6620s -she has been out of her inhaler and has been using significant other's inhaler -states that her inhaler does not help -endorses exacerbations about once a week -usually resolve on their own if patient rest and does deep breathing -wants to know if she can get a refill  -patient is a smoker  Health Maintenance: Due for pap smear and lab testing.   ROS reviewed and were negative unless otherwise noted in HPI. Pertinently, no chest pain, palpitations, SOB, Fever, Chills, Abd pain, N/V/D.  Past Medical, Surgical, Social, and Family History Reviewed & Updated per EMR.   Objective: BP 101/68 mmHg  Pulse 65  Temp(Src) 98.4 F (36.9 C) (Oral)  Ht 5\' 6"  (1.676 m)  Wt 117 lb (53.071 kg)  BMI 18.89 kg/m2  LMP 11/09/2014 (Exact Date)  Physical Exam  Constitutional: She is oriented to person, place, and time and well-developed, well-nourished, and in no distress.  HENT:  Head: Normocephalic and atraumatic.  Mouth/Throat: Oropharynx is clear and moist.  Cerumen impaction of bilateral ears  Eyes: Conjunctivae and EOM are normal. Pupils are equal, round, and reactive to light.  Neck: Normal range of motion. Neck supple. No  thyromegaly present.  Cardiovascular: Normal rate, regular rhythm, normal heart sounds and intact distal pulses.   Pulmonary/Chest: Effort normal and breath sounds normal. She has no wheezes.  Abdominal: Soft. Bowel sounds are normal. She exhibits no mass. There is no tenderness.  Musculoskeletal: Normal range of motion. She exhibits no edema.  Lymphadenopathy:    She has no cervical adenopathy.  Neurological: She is alert and oriented to person, place, and time. No cranial nerve deficit. Gait normal.  Skin: Skin is warm, dry and intact.  L. Great toe with onychomycosis and curvature. R. Foot with plantar warts in between toes. Bilateral bunions.   Psychiatric:  Anxious appearing    Assessment/Plan: Please see problem based Assessment and Plan   Meds ordered this encounter  Medications  . albuterol (PROVENTIL HFA;VENTOLIN HFA) 108 (90 BASE) MCG/ACT inhaler    Sig: Inhale 2 puffs into the lungs every 4 (four) hours as needed for wheezing or shortness of breath.    Dispense:  1 Inhaler    Refill:  2    [name], DO 11/19/2014, 9:16 AM PGY-1, [facility]

## 2014-11-19 NOTE — Assessment & Plan Note (Signed)
A: bilateral feet with multiple abnormal findings including onychomycosis, bunions, and plantar warts P: Patient to return to clinic for further work-up of foot pain and abnormalities. Can likely do cryotherapy for warts and toenail removal. Will consider podiatry referral if needed.

## 2014-11-19 NOTE — Assessment & Plan Note (Signed)
This visit was to establish care. Patient to return to clinic within one month to discuss other medical conditions and receive blood work and pap smear.

## 2014-11-19 NOTE — Assessment & Plan Note (Signed)
A: current everyday smoker. Currently cutting back on amount. Now down to 3-4 cigs/d from 1/2 ppd P: counseled patient on tobacco use. Encouraged her to continue to cut back and ultimately quit. Believe this will improve some of her respiratory symptoms.

## 2014-11-19 NOTE — Assessment & Plan Note (Signed)
A: With exertional exacerbations. P: Refilled albuterol prn inhaler for patient.

## 2014-12-02 ENCOUNTER — Encounter: Payer: 59 | Admitting: Obstetrics and Gynecology

## 2014-12-15 ENCOUNTER — Encounter: Payer: Self-pay | Admitting: Obstetrics and Gynecology

## 2014-12-15 ENCOUNTER — Other Ambulatory Visit (HOSPITAL_COMMUNITY)
Admission: RE | Admit: 2014-12-15 | Discharge: 2014-12-15 | Disposition: A | Discharge status: Home / Self Care | Payer: 59 | Source: Ambulatory Visit | Attending: Family Medicine | Admitting: Family Medicine

## 2014-12-15 ENCOUNTER — Ambulatory Visit (INDEPENDENT_AMBULATORY_CARE_PROVIDER_SITE_OTHER): Payer: 59 | Admitting: Obstetrics and Gynecology

## 2014-12-15 VITALS — BP 128/76 | HR 83 | Temp 98.7°F | Ht 66.0 in | Wt 116.0 lb

## 2014-12-15 DIAGNOSIS — Z0000 Encounter for general adult medical examination without abnormal findings: Secondary | ICD-10-CM | POA: Diagnosis not present

## 2014-12-15 DIAGNOSIS — M259 Joint disorder, unspecified: Secondary | ICD-10-CM

## 2014-12-15 DIAGNOSIS — N898 Other specified noninflammatory disorders of vagina: Secondary | ICD-10-CM

## 2014-12-15 DIAGNOSIS — Z1151 Encounter for screening for human papillomavirus (HPV): Secondary | ICD-10-CM | POA: Diagnosis present

## 2014-12-15 DIAGNOSIS — R5383 Other fatigue: Secondary | ICD-10-CM

## 2014-12-15 DIAGNOSIS — Z01411 Encounter for gynecological examination (general) (routine) with abnormal findings: Secondary | ICD-10-CM | POA: Insufficient documentation

## 2014-12-15 DIAGNOSIS — Z23 Encounter for immunization: Secondary | ICD-10-CM | POA: Diagnosis not present

## 2014-12-15 DIAGNOSIS — R87810 Cervical high risk human papillomavirus (HPV) DNA test positive: Secondary | ICD-10-CM | POA: Insufficient documentation

## 2014-12-15 DIAGNOSIS — Z113 Encounter for screening for infections with a predominantly sexual mode of transmission: Secondary | ICD-10-CM | POA: Insufficient documentation

## 2014-12-15 DIAGNOSIS — Z124 Encounter for screening for malignant neoplasm of cervix: Secondary | ICD-10-CM

## 2014-12-15 DIAGNOSIS — Z202 Contact with and (suspected) exposure to infections with a predominantly sexual mode of transmission: Secondary | ICD-10-CM

## 2014-12-15 DIAGNOSIS — R2991 Unspecified symptoms and signs involving the musculoskeletal system: Secondary | ICD-10-CM

## 2014-12-15 LAB — CBC
HCT: 40.6 % (ref 36.0–46.0)
Hemoglobin: 13.4 g/dL (ref 12.0–15.0)
MCH: 28.3 pg (ref 26.0–34.0)
MCHC: 33 g/dL (ref 30.0–36.0)
MCV: 85.8 fL (ref 78.0–100.0)
MPV: 10.7 fL (ref 8.6–12.4)
PLATELETS: 249 10*3/uL (ref 150–400)
RBC: 4.73 MIL/uL (ref 3.87–5.11)
RDW: 13.5 % (ref 11.5–15.5)
WBC: 6.6 10*3/uL (ref 4.0–10.5)

## 2014-12-15 LAB — POCT WET PREP (WET MOUNT)
CLUE CELLS WET PREP WHIFF POC: NEGATIVE
WBC, Wet Prep HPF POC: >20

## 2014-12-15 MED ORDER — IBUPROFEN 600 MG PO TABS: 600.0000 mg | ORAL_TABLET | Freq: Three times a day (TID) | ORAL | Status: DC | PRN

## 2014-12-15 NOTE — Patient Instructions (Signed)
It was great to see you today!  I am pleased to hear that things are going well for you.  Here are some of the things we discussed today: -I am getting blood work and performed a pap. I will contact you to let you know the outcome of your results. -Please see below on information about fatigue -I referred you to podiatry for your feet -Ibuprofen to use as needed for pain  Thanks for allowing me to be a part of your care! Dr. Doroteo Glassman    Fatigue Fatigue is a feeling of tiredness, lack of energy, lack of motivation, or feeling tired all the time. Having enough rest, good nutrition, and reducing stress will normally reduce fatigue. Consult your caregiver if it persists. The nature of your fatigue will help your caregiver to find out its cause. The treatment is based on the cause.  CAUSES  There are many causes for fatigue. Most of the time, fatigue can be traced to one or more of your habits or routines. Most causes fit into one or more of three general areas. They are: Lifestyle problems  Sleep disturbances.  Overwork.  Physical exertion.  Unhealthy habits.  Poor eating habits or eating disorders.  Alcohol and/or drug use .  Lack of proper nutrition (malnutrition). Psychological problems  Stress and/or anxiety problems.  Depression.  Grief.  Boredom. Medical Problems or Conditions  Anemia.  Pregnancy.  Thyroid gland problems.  Recovery from major surgery.  Continuous pain.  Emphysema or asthma that is not well controlled  Allergic conditions.  Diabetes.  Infections (such as mononucleosis).  Obesity.  Sleep disorders, such as sleep apnea.  Heart failure or other heart-related problems.  Cancer.  Kidney disease.  Liver disease.  Effects of certain medicines such as antihistamines, cough and cold remedies, prescription pain medicines, heart and blood pressure medicines, drugs used for treatment of cancer, and some antidepressants. SYMPTOMS  The  symptoms of fatigue include:   Lack of energy.  Lack of drive (motivation).  Drowsiness.  Feeling of indifference to the surroundings. DIAGNOSIS  The details of how you feel help guide your caregiver in finding out what is causing the fatigue. You will be asked about your present and past health condition. It is important to review all medicines that you take, including prescription and non-prescription items. A thorough exam will be done. You will be questioned about your feelings, habits, and normal lifestyle. Your caregiver may suggest blood tests, urine tests, or other tests to look for common medical causes of fatigue.  TREATMENT  Fatigue is treated by correcting the underlying cause. For example, if you have continuous pain or depression, treating these causes will improve how you feel. Similarly, adjusting the dose of certain medicines will help in reducing fatigue.  HOME CARE INSTRUCTIONS   Try to get the required amount of good sleep every night.  Eat a healthy and nutritious diet, and drink enough water throughout the day.  Practice ways of relaxing (including yoga or meditation).  Exercise regularly.  Make plans to change situations that cause stress. Act on those plans so that stresses decrease over time. Keep your work and personal routine reasonable.  Avoid street drugs and minimize use of alcohol.  Start taking a daily multivitamin after consulting your caregiver. SEEK MEDICAL CARE IF:   You have persistent tiredness, which cannot be accounted for.  You have fever.  You have unintentional weight loss.  You have headaches.  You have disturbed sleep throughout the night.  You are feeling sad.  You have constipation.  You have dry skin.  You have gained weight.  You are taking any new or different medicines that you suspect are causing fatigue.  You are unable to sleep at night.  You develop any unusual swelling of your legs or other parts of your  body. SEEK IMMEDIATE MEDICAL CARE IF:   You are feeling confused.  Your vision is blurred.  You feel faint or pass out.  You develop severe headache.  You develop severe abdominal, pelvic, or back pain.  You develop chest pain, shortness of breath, or an irregular or fast heartbeat.  You are unable to pass a normal amount of urine.  You develop abnormal bleeding such as bleeding from the rectum or you vomit blood.  You have thoughts about harming yourself or committing suicide.  You are worried that you might harm someone else. MAKE SURE YOU:   Understand these instructions.  Will watch your condition.  Will get help right away if you are not doing well or get worse. Document Released: 05/22/2007 Document Revised: 10/17/2011 Document Reviewed: 11/26/2013 [facility] Patient Information 2015 [place], [place]. This information is not intended to replace advice given to you by your health care provider. Make sure you discuss any questions you have with your health care provider.   Tdap Vaccine (Tetanus, Diphtheria, Pertussis): What You Need to Know 1. Why get vaccinated? Tetanus, diphtheria and pertussis can be very serious diseases, even for adolescents and adults. Tdap vaccine can protect [place] from these diseases. TETANUS (Lockjaw) causes painful muscle tightening and stiffness, usually all over the body.  It can lead to tightening of muscles in the head and neck so you can't open your mouth, swallow, or sometimes even breathe. Tetanus kills about 1 out of 5 people who are infected. DIPHTHERIA can cause a thick coating to form in the back of the throat.  It can lead to breathing problems, paralysis, heart failure, and death. PERTUSSIS (Whooping Cough) causes severe coughing spells, which can cause difficulty breathing, vomiting and disturbed sleep.  It can also lead to weight loss, incontinence, and rib fractures. Up to 2 in 100 adolescents and 5 in 100 adults with pertussis are  hospitalized or have complications, which could include pneumonia or death. These diseases are caused by bacteria. Diphtheria and pertussis are spread from person to person through coughing or sneezing. Tetanus enters the body through cuts, scratches, or wounds. Before vaccines, the [place] saw as many as 200,000 cases a year of diphtheria and pertussis, and hundreds of cases of tetanus. Since vaccination began, tetanus and diphtheria have dropped by about 99% and pertussis by about 80%. 2. Tdap vaccine Tdap vaccine can protect adolescents and adults from tetanus, diphtheria, and pertussis. One dose of Tdap is routinely given at age 71 or 65. People who did not get Tdap at that age should get it as soon as possible. Tdap is especially important for health care professionals and anyone having close contact with a baby younger than 12 months. Pregnant women should get a dose of Tdap during every pregnancy, to protect the newborn from pertussis. Infants are most at risk for severe, life-threatening complications from pertussis. A similar vaccine, called Td, protects from tetanus and diphtheria, but not pertussis. A Td booster should be given every 10 years. Tdap may be given as one of these boosters if you have not already gotten a dose. Tdap may also be given after a severe cut or burn to  prevent tetanus infection. Your doctor can give you more information. Tdap may safely be given at the same time as other vaccines. 3. Some people should not get this vaccine  If you ever had a life-threatening allergic reaction after a dose of any tetanus, diphtheria, or pertussis containing vaccine, OR if you have a severe allergy to any part of this vaccine, you should not get Tdap. Tell your doctor if you have any severe allergies.  If you had a coma, or long or multiple seizures within 7 days after a childhood dose of DTP or DTaP, you should not get Tdap, unless a cause other than the vaccine was found. You  can still get Td.  Talk to your doctor if you:  have epilepsy or another nervous system problem,  had severe pain or swelling after any vaccine containing diphtheria, tetanus or pertussis,  ever had Guillain-Barr Syndrome (GBS),  aren't feeling well on the day the shot is scheduled. 4. Risks of a vaccine reaction With any medicine, including vaccines, there is a chance of side effects. These are usually mild and go away on their own, but serious reactions are also possible. Brief fainting spells can follow a vaccination, leading to injuries from falling. Sitting or lying down for about 15 minutes can help prevent these. Tell your doctor if you feel dizzy or light-headed, or have vision changes or ringing in the ears. Mild problems following Tdap (Did not interfere with activities)  Pain where the shot was given (about 3 in 4 adolescents or 2 in 3 adults)  Redness or swelling where the shot was given (about 1 person in 5)  Mild fever of at least 100.22F (up to about 1 in 25 adolescents or 1 in 100 adults)  Headache (about 3 or 4 people in 10)  Tiredness (about 1 person in 3 or 4)  Nausea, vomiting, diarrhea, stomach ache (up to 1 in 4 adolescents or 1 in 10 adults)  Chills, body aches, sore joints, rash, swollen glands (uncommon) Moderate problems following Tdap (Interfered with activities, but did not require medical attention)  Pain where the shot was given (about 1 in 5 adolescents or 1 in 100 adults)  Redness or swelling where the shot was given (up to about 1 in 16 adolescents or 1 in 25 adults)  Fever over 102F (about 1 in 100 adolescents or 1 in 250 adults)  Headache (about 3 in 20 adolescents or 1 in 10 adults)  Nausea, vomiting, diarrhea, stomach ache (up to 1 or 3 people in 100)  Swelling of the entire arm where the shot was given (up to about 3 in 100). Severe problems following Tdap (Unable to perform usual activities; required medical attention)  Swelling,  severe pain, bleeding and redness in the arm where the shot was given (rare). A severe allergic reaction could occur after any vaccine (estimated less than 1 in a million doses). 5. What if there is a serious reaction? What should I look for?  Look for anything that concerns you, such as signs of a severe allergic reaction, very high fever, or behavior changes. Signs of a severe allergic reaction can include hives, swelling of the face and throat, difficulty breathing, a fast heartbeat, dizziness, and weakness. These would start a few minutes to a few hours after the vaccination. What should I do?  If you think it is a severe allergic reaction or other emergency that can't wait, call 9-1-1 or get the person to the nearest hospital. Otherwise,  call your doctor.  Afterward, the reaction should be reported to the "Vaccine Adverse Event Reporting System" (VAERS). Your doctor might file this report, or you can do it yourself through the VAERS web site at www.vaers.hhs.gov, or by calling 1-402-622-2031. VAERS is only for reporting reactLAgents.noions. They do not give medical advice.  6. The National Vaccine Injury Compensation Program The [employer] Vaccine Injury Compensation Program (VICP) is a federal program that was created to compensate people who may have been injured by certain vaccines. Persons who believe they may have been injured by a vaccine can learn about the program and about filing a claim by calling 1-240 180 7927 or visiting the VICP website at SpiritualWord.atwww.hrsa.gov/vaccinecompensation. 7. How can I learn more?  Ask your doctor.  Call your local or state health department.  Contact the Centers for Disease Control and Prevention (CDC):  Call 838-522-46711-715-626-1987 or visit CDC's website at PicCapture.uywww.cdc.gov/vaccines. CDC Tdap Vaccine VIS (12/15/11) Document Released: 01/24/2012 Document Revised: 12/09/2013 Document Reviewed: 11/06/2013 [employer] Patient Information 2015 [place] [place]. This information is not  intended to replace advice given to you by your health care provider. Make sure you discuss any questions you have with your health care provider.

## 2014-12-15 NOTE — Progress Notes (Signed)
Subjective: Chief Complaint  Patient presents with  . Annual Exam  . Fatigue    HPI: Allison Foley is a 34 y.o. presenting to clinic today for well woman/preventative visit and annual GYN examination.  Acute Concerns:  1. Fatigue - States that she feels fatigued all the time even on days she does not work. Has been intermitted for the last several months. Works 40-45hrs a week. On average sleeps about 9hrs a night. Denies any bleeding, cold intolerance, CP, SOB. Used to play sports and be active now has no energy.   2. Foot Pain - Continues to endorse foot pain that is 8/10.  Diet: Loves to eat but other times doesn't feel like eating. Especially fatigued after eating. Doesn't eat fruits. One vegetable a day.   Exercise: work is the only exercise; walks all day at work  Albertson'sSexual/Birth History: never pregnant; sexually active with partner(female)  Birth Control: No BC  POA/Living Will: No official documentation but would like mom to make any decisions  Social:  History   Social History  . Marital Status: Single    Spouse Name: N/A  . Number of Children: N/A  . Years of Education: N/A   Occupational History  . order processor [employer]   Social History Main Topics  . Smoking status: Current Every Day Smoker -- 0.40 packs/day for 14 years    Types: Cigarettes  . Smokeless tobacco: Never Used     Comment: working on quitting  . Alcohol Use: 0.0 oz/week    0 Standard drinks or equivalent per week     Comment: Occasional beer  . Drug Use: No  . Sexual Activity: Yes    Birth Control/ Protection: None   Other Topics Concern  . None   Social History Narrative    Immunization:  Tdap/TD: Early 2000s last vaccine remembered; due  Influenza: Never wants flu shot  Cancer Screening:  Pap Smear: Due   Health Maintenance: Due for pap smear, HIV screening, and Tdap  ROS per HPI.  Past Medical, Surgical, Social, and Family History Reviewed & Updated per  EMR.   Objective: BP 128/76 mmHg  Pulse 83  Temp(Src) 98.7 F (37.1 C) (Oral)  Ht 5\' 6"  (1.676 m)  Wt 116 lb (52.617 kg)  BMI 18.73 kg/m2  LMP 11/09/2014 (Exact Date)  Physical Exam Constitutional: She is oriented to person, place, and time and well-developed, well-nourished, and in no distress.  HENT:  Head: Normocephalic and atraumatic.  Mouth/Throat: Oropharynx is clear and moist.  Eyes: Conjunctivae and EOM are normal. Pupils are equal, round, and reactive to light.  Neck: Normal range of motion. Neck supple. No thyromegaly present.  Cardiovascular: Normal rate, regular rhythm, normal heart sounds and intact distal pulses.  Pulmonary/Chest: Effort normal and breath sounds normal. She has no wheezes.  Abdominal: Soft. Bowel sounds are normal. She exhibits no mass. There is no tenderness.  Genitalia:  Normal introitus for age, no external lesions, vaginal discharge present, mucosa pink and moist, no vaginal or cervical lesions, no vaginal atrophy, no friaility or hemorrhage, normal uterus size and position, no adnexal masses or tenderness Musculoskeletal: Normal range of motion. She exhibits no edema.  Neurological: She is alert and oriented to person, place, and time. No cranial nerve deficit. Gait normal.  Skin: Skin is warm, dry and intact.  Feet: L. Great toe with onychomycosis and curvature. R. Foot with plantar warts in between toes. Bilateral bunions.  Psychiatric: Mood and affect normal.  Results for orders placed or performed in visit on 12/15/14 (from the past 72 hour(s))  POCT Wet Prep Mellody Drown([facility] [place])     Status: Abnormal   Collection Time: 12/15/14  3:15 PM  Result Value Ref Range   Source Wet Prep POC VAG    WBC, Wet Prep HPF POC >20    Bacteria Wet Prep HPF POC 3+ RODS    Clue Cells Wet Prep HPF POC None    Clue Cells Wet Prep Whiff POC Negative Whiff    Yeast Wet Prep HPF POC Few    Trichomonas Wet Prep HPF POC NONE     Assessment/Plan: 34 y.o.  presents  for annual well woman/preventative exam and GYN exam.  Please see problem based Assessment and Plan   Orders Placed This Encounter  Procedures  . Tdap vaccine greater than or equal to 7yo IM  . CBC  . BASIC METABOLIC PANEL WITH GFR  . TSH  . HIV antibody  . Ambulatory referral to Podiatry    Referral Priority:  Routine    Referral Type:  Consultation    Referral Reason:  Specialty Services Required    Requested Specialty:  Podiatry    Number of Visits Requested:  1  . POCT Wet Prep [employer]([facility] [place])    Meds ordered this encounter  Medications  . ibuprofen (ADVIL,MOTRIN) 600 MG tablet    Sig: Take 1 tablet (600 mg total) by mouth every 8 (eight) hours as needed.    Dispense:  30 tablet    Refill:  1    Caryl AdaJazma Phelps, DO 12/15/2014, 8:32 PM PGY-1, [facility]

## 2014-12-15 NOTE — Assessment & Plan Note (Signed)
Pap smear performed today. Patient also received HIV screening and Tdap vaccine. Now up to date on all healthcare maintenance.

## 2014-12-15 NOTE — Assessment & Plan Note (Signed)
Fatigue of unknown origin at this time. Will obtain initial blood work to include CBC, BMP, and TSH. Handout given on fatigue.

## 2014-12-15 NOTE — Assessment & Plan Note (Signed)
Continues to endorse continued pain from bilateral feet.PEconsistent with multple abnormal foot findings. Referral to podiatry placed.

## 2014-12-15 NOTE — Assessment & Plan Note (Addendum)
Abnormal vaginal discharge noted on PE so wet prep obtained. Wet prep with signs of yeast. Will treat with fluconazole one dose.

## 2014-12-16 LAB — CERVICOVAGINAL ANCILLARY ONLY
Chlamydia: NEGATIVE
Neisseria Gonorrhea: NEGATIVE

## 2014-12-16 LAB — BASIC METABOLIC PANEL WITH GFR
BUN: 19 mg/dL (ref 6–23)
CHLORIDE: 104 meq/L (ref 96–112)
CO2: 25 mEq/L (ref 19–32)
CREATININE: 1.04 mg/dL (ref 0.50–1.10)
Calcium: 9 mg/dL (ref 8.4–10.5)
GFR, EST AFRICAN AMERICAN: 82 mL/min
GFR, EST NON AFRICAN AMERICAN: 71 mL/min
Glucose, Bld: 87 mg/dL (ref 70–99)
Potassium: 4.7 mEq/L (ref 3.5–5.3)
Sodium: 137 mEq/L (ref 135–145)

## 2014-12-16 LAB — HIV ANTIBODY (ROUTINE TESTING W REFLEX): HIV 1&2 Ab, 4th Generation: NONREACTIVE

## 2014-12-16 LAB — TSH: TSH: 0.557 u[IU]/mL (ref 0.350–4.500)

## 2014-12-16 LAB — CYTOLOGY - PAP

## 2014-12-19 ENCOUNTER — Encounter: Payer: Self-pay | Admitting: Obstetrics and Gynecology

## 2014-12-19 ENCOUNTER — Encounter: Payer: Self-pay | Admitting: Family Medicine

## 2014-12-19 ENCOUNTER — Other Ambulatory Visit: Payer: Self-pay | Admitting: Obstetrics and Gynecology

## 2014-12-19 ENCOUNTER — Telehealth: Payer: Self-pay | Admitting: *Deleted

## 2014-12-19 MED ORDER — FLUCONAZOLE 150 MG PO TABS: 150.0000 mg | ORAL_TABLET | Freq: Once | ORAL | Status: AC

## 2014-12-19 NOTE — Telephone Encounter (Signed)
LMTCB to advise as directed below, will try again later. Kienna Moncada, CMA.

## 2014-12-19 NOTE — Progress Notes (Signed)
Letter was intercepted before being mailed off. Voided the letter in Epic. Called patient to discuss results directly. She is aware of abnormal pap results and need to come in and schedule colposcopy. I am thankful that this was caught and brought to my attention.   Caryl AdaJazma Phelps, DO PGY-1, Family Medicine

## 2014-12-19 NOTE — Telephone Encounter (Signed)
Discussed all results with patient and need to go to pharmacy to pick up Rx. She will be calling back on Monday to schedule colposcopy in Gyn clinic for abnormal pap smear.

## 2014-12-19 NOTE — Progress Notes (Signed)
Patient ID: Allison Foley, female   DOB: [birth year], 34 y.o.   MRN: [id number] I was notified about a normal PAP result letter mailed to patient although the PAP was actually abnormal. I discussed this case with Dr Doroteo GlassmanPhelps, she confirmed the letter was retrieved by her yesterday, she contacted the patient directly via phone about the result. I recommended she ( Dr Doroteo GlassmanPhelps) fill up a safety portal zone report for a near-miss case. She agreed with plan.

## 2014-12-19 NOTE — Progress Notes (Signed)
Patient recent abnormal PAP results received; letter had been written to patient to notify of normal results.  [facility]  I spoke with Dr. Doroteo GlassmanPhelps, who is in the hospital and will try to intercept before the letter goes out.  She will call the patient to discuss the abnormal results.  Paula ComptonJames Yajayra Feldt, MD

## 2014-12-19 NOTE — Telephone Encounter (Signed)
-----   Message from Pincus LargeJazma Y Phelps, DO sent at 12/19/2014  9:11 AM EDT ----- [facility], please call pt and let her know that lab showed she had a yeast infection (this is a common benign finding in women, can happen when vaginal pH is off). I sent to her pharmacy a pill (one dose) to take to treat. I mailed a letter to hear about her other results. Basically everything was normal except for the yeast.  Thanks! Pincus LargeJazma Y Phelps, DO

## 2014-12-19 NOTE — Telephone Encounter (Signed)
[facility] make a report for an abnormal PAP. Forwarding to Dr. Lamonte SakaiZimmerman Rumple, April D, CMA

## 2014-12-25 ENCOUNTER — Encounter: Payer: Self-pay | Admitting: Podiatry

## 2014-12-25 ENCOUNTER — Ambulatory Visit (INDEPENDENT_AMBULATORY_CARE_PROVIDER_SITE_OTHER): Payer: 59

## 2014-12-25 ENCOUNTER — Ambulatory Visit (INDEPENDENT_AMBULATORY_CARE_PROVIDER_SITE_OTHER): Payer: 59 | Admitting: Podiatry

## 2014-12-25 VITALS — BP 113/73 | HR 78 | Resp 11 | Ht 66.0 in | Wt 116.0 lb

## 2014-12-25 DIAGNOSIS — M2042 Other hammer toe(s) (acquired), left foot: Secondary | ICD-10-CM

## 2014-12-25 DIAGNOSIS — M2011 Hallux valgus (acquired), right foot: Secondary | ICD-10-CM

## 2014-12-25 DIAGNOSIS — M2012 Hallux valgus (acquired), left foot: Secondary | ICD-10-CM | POA: Diagnosis not present

## 2014-12-25 DIAGNOSIS — M2041 Other hammer toe(s) (acquired), right foot: Secondary | ICD-10-CM | POA: Diagnosis not present

## 2014-12-25 DIAGNOSIS — M21612 Bunion of left foot: Secondary | ICD-10-CM

## 2014-12-25 DIAGNOSIS — L600 Ingrowing nail: Secondary | ICD-10-CM | POA: Diagnosis not present

## 2014-12-25 DIAGNOSIS — M21611 Bunion of right foot: Secondary | ICD-10-CM

## 2014-12-25 DIAGNOSIS — Q786 Multiple congenital exostoses: Secondary | ICD-10-CM | POA: Diagnosis not present

## 2014-12-25 DIAGNOSIS — M898X0 Other specified disorders of bone, multiple sites: Secondary | ICD-10-CM

## 2014-12-25 NOTE — Progress Notes (Signed)
   Subjective:    Patient ID: Allison Foley, female    DOB: 24-Jun-1981, 34 y.o.   MRN: [id number]  HPI    Review of Systems  Constitutional: Positive for appetite change, fatigue and unexpected weight change.  HENT: Positive for hearing loss, sinus pressure and tinnitus.   Eyes: Positive for itching.  Respiratory: Positive for chest tightness and shortness of breath.   Cardiovascular: Positive for chest pain and leg swelling.       Pain in calf with walking.  Endocrine: Positive for polydipsia.  Musculoskeletal: Positive for myalgias, back pain, arthralgias and gait problem.  Allergic/Immunologic: Positive for food allergies.       Cucumbers  Neurological: Positive for weakness, light-headedness and headaches.  All other systems reviewed and are negative.      Objective:   Physical Exam        Assessment & Plan:

## 2014-12-26 NOTE — Progress Notes (Signed)
Subjective:     Patient ID: Allison Foley, female   DOB: 19-May-1981, 34 y.o.   MRN: [id number]  HPI patient presents stating I have bad bunions on both my feet I did a very painful corn between the second and third toes of my right foot and my fifth toe left foot and I have tried to trim them and pad them without relief of symptoms. Significant family history for condition   Review of Systems  All other systems reviewed and are negative.      Objective:   Physical Exam  Constitutional: She is oriented to person, place, and time.  Cardiovascular: Intact distal pulses.   Musculoskeletal: Normal range of motion.  Neurological: She is oriented to person, place, and time.  Skin: Skin is warm.  Nursing note and vitals reviewed.  neurovascular status intact with muscle strength adequate range of motion within normal limits. Patient's noted to have hyperostosis with redness medial aspect first metatarsal bilateral keratotic lesion between the second and third toes of the right foot and also a distal medial keratotic lesion fifth toe that's very painful when pressed. Patient has good digital perfusion is well oriented 3 and has significant depression of the arch bilateral     Assessment:     Foot structural issues with family history and bunion deformity hammertoe deformity and exostosis along with tendinitis secondary to foot structure    Plan:     H&P and conditions reviewed and x-rays reviewed with patient. At this point I do think correction due to her young age and pain is warranted and I recommended [place] bilateral arthroplasty digits 23 right and exostectomy fifth left. Patient wants surgery but needs to wait about 6 weeks to do this and she's tenably scheduled for procedure. We will see her back prior to go over everything in detail and at this time she is scanned for custom orthotics to reduce stress on her feet

## 2015-01-08 ENCOUNTER — Encounter: Payer: Self-pay | Admitting: Family Medicine

## 2015-01-08 ENCOUNTER — Encounter: Payer: Self-pay | Admitting: Podiatry

## 2015-01-08 ENCOUNTER — Ambulatory Visit (INDEPENDENT_AMBULATORY_CARE_PROVIDER_SITE_OTHER): Payer: 59 | Admitting: Podiatry

## 2015-01-08 ENCOUNTER — Ambulatory Visit (INDEPENDENT_AMBULATORY_CARE_PROVIDER_SITE_OTHER): Payer: 59 | Admitting: Family Medicine

## 2015-01-08 VITALS — BP 117/77 | HR 75 | Temp 98.2°F | Ht 66.0 in | Wt 116.2 lb

## 2015-01-08 VITALS — BP 115/72 | HR 87 | Resp 12

## 2015-01-08 DIAGNOSIS — Q786 Multiple congenital exostoses: Secondary | ICD-10-CM | POA: Diagnosis not present

## 2015-01-08 DIAGNOSIS — M898X0 Other specified disorders of bone, multiple sites: Secondary | ICD-10-CM

## 2015-01-08 DIAGNOSIS — M2042 Other hammer toe(s) (acquired), left foot: Secondary | ICD-10-CM

## 2015-01-08 DIAGNOSIS — M2011 Hallux valgus (acquired), right foot: Secondary | ICD-10-CM

## 2015-01-08 DIAGNOSIS — L600 Ingrowing nail: Secondary | ICD-10-CM | POA: Diagnosis not present

## 2015-01-08 DIAGNOSIS — M2041 Other hammer toe(s) (acquired), right foot: Secondary | ICD-10-CM | POA: Diagnosis not present

## 2015-01-08 DIAGNOSIS — M2012 Hallux valgus (acquired), left foot: Secondary | ICD-10-CM | POA: Diagnosis not present

## 2015-01-08 DIAGNOSIS — B351 Tinea unguium: Secondary | ICD-10-CM

## 2015-01-08 DIAGNOSIS — R87810 Cervical high risk human papillomavirus (HPV) DNA test positive: Secondary | ICD-10-CM

## 2015-01-08 DIAGNOSIS — M21611 Bunion of right foot: Secondary | ICD-10-CM

## 2015-01-08 DIAGNOSIS — R87613 High grade squamous intraepithelial lesion on cytologic smear of cervix (HGSIL): Secondary | ICD-10-CM | POA: Diagnosis not present

## 2015-01-08 DIAGNOSIS — N949 Unspecified condition associated with female genital organs and menstrual cycle: Secondary | ICD-10-CM

## 2015-01-08 DIAGNOSIS — M21612 Bunion of left foot: Principal | ICD-10-CM

## 2015-01-08 DIAGNOSIS — N9489 Other specified conditions associated with female genital organs and menstrual cycle: Secondary | ICD-10-CM

## 2015-01-08 MED ORDER — ACETAMINOPHEN 500 MG PO TABS
500.0000 mg | ORAL_TABLET | Freq: Once | ORAL | Status: AC
  Administered 2015-01-08: 500 mg via ORAL

## 2015-01-08 NOTE — Progress Notes (Deleted)
Subjective:     Patient ID: Allison Foley, female   DOB: 08-11-1980, 34 y.o.   MRN: [id number]  HPI   Review of Systems     Objective:   Physical Exam     Assessment:     ***    Plan:     ***

## 2015-01-08 NOTE — Progress Notes (Signed)
Patient ID: Allison Foley, female   DOB: 12-07-1980, 34 y.o.   MRN: [id number]  No chief complaint on file.   HPI Allison Foley is a 34 y.o. female.  Here for colposcopy due to abnormal PAP ( HSIL and HPV)  HPI  Indications: Pap smear on May 2016 showed: high-grade squamous intraepithelial neoplasia  (HGSIL-encompassing moderate and severe dysplasia). Previous colposcopy: None. Prior cervical treatment: no treatment.  Past Medical History  Diagnosis Date  . Asthma   . Anxiety     Past Surgical History  Procedure Laterality Date  . Knee surgery      Family History  Problem Relation Age of Onset  . Diabetes Mother     Social History History  Substance Use Topics  . Smoking status: Current Every Day Smoker -- 0.40 packs/day for 14 years    Types: Cigarettes  . Smokeless tobacco: Never Used     Comment: working on quitting  . Alcohol Use: 0.0 oz/week    0 Standard drinks or equivalent per week     Comment: Occasional beer    Allergies  Allergen Reactions  . Darvocet [Propoxyphene N-Acetaminophen] Other (See Comments)    nosebleeds    Current Outpatient Prescriptions  Medication Sig Dispense Refill  . albuterol (PROVENTIL HFA;VENTOLIN HFA) 108 (90 BASE) MCG/ACT inhaler Inhale 2 puffs into the lungs every 4 (four) hours as needed for wheezing or shortness of breath. 1 Inhaler 2  . bacitracin ointment Apply 1 application topically 2 (two) times daily as needed (itching on legs).    . clindamycin (CLEOCIN) 300 MG capsule Take 1 capsule (300 mg total) by mouth 3 (three) times daily. (Patient not taking: Reported on 11/18/2014) 60 capsule 0  . clonazePAM (KLONOPIN) 1 MG tablet Take 1 mg by mouth 2 (two) times daily as needed for anxiety.    . fluconazole (DIFLUCAN) 150 MG tablet Take 1 tablet (150 mg total) by mouth once. (Patient not taking: Reported on 12/25/2014) 1 tablet 0  . HYDROcodone-acetaminophen (NORCO/VICODIN) 5-325 MG per tablet Take 1 tablet by mouth at  bedtime as needed for moderate pain. (Patient not taking: Reported on 11/18/2014) 15 tablet 0  . hydrocortisone cream 1 % Apply topically 2 (two) times daily. Do not apply to face (Patient not taking: Reported on 11/18/2014) 15 g 1  . ibuprofen (ADVIL,MOTRIN) 600 MG tablet Take 1 tablet (600 mg total) by mouth every 8 (eight) hours as needed. 30 tablet 1  . Multiple Vitamins-Minerals (ADULT GUMMY) CHEW Chew 1 capsule by mouth daily.    . permethrin (ELIMITE) 5 % cream Apply to entire body other than face - let sit for 14 hours then wash off, may repeat in 1 week if still having symptoms (Patient not taking: Reported on 11/18/2014) 60 g 1   No current facility-administered medications for this visit.    Review of Systems Review of Systems  Respiratory: Negative.   Cardiovascular: Negative.   Gastrointestinal: Negative.   Genitourinary: Negative.        Just getting off her period  All other systems reviewed and are negative.   Blood pressure 117/77, temperature 98.2 F (36.8 C), weight 116 lb 3.2 oz (52.708 kg), last menstrual period 01/03/2015.  Physical Exam Physical Exam  Constitutional: She appears well-developed.  Pulmonary/Chest: Effort normal.  Genitourinary: Vagina normal. There is no tenderness on the right labia. There is no tenderness on the left labia. Cervix exhibits no motion tenderness, no discharge and no friability.    Nursing  note and vitals reviewed.   Data Reviewed PAP  Assessment    Procedure Details  The risks and benefits of the procedure and Written informed consent obtained.  Speculum placed in vagina and excellent visualization of cervix achieved, cervix swabbed x 3 with acetic acid solution.  Specimens: ECC and cervical biopsy.  Complications: none.     Plan    Specimens labelled and sent to Pathology. Recommendation for HSIL and HPV is LEEP regardless of colposcopy. I will refer her to gyn for LEEP. I will call her with pathology report.        Janit Pagan 01/08/2015, 11:05 AM

## 2015-01-08 NOTE — Progress Notes (Signed)
   Subjective:    Patient ID: Allison Foley, female    DOB: 06/03/1981, 34 y.o.   MRN: [id number]  HPI  F/up BIL bunions and corns on R 2-3 and L 5  Review of Systems     Objective:   Physical Exam        Assessment & Plan:

## 2015-01-08 NOTE — Addendum Note (Signed)
Addended by: Clovis PuMARTIN, TAMIKA L on: 01/08/2015 03:05 PM   Modules accepted: Orders

## 2015-01-08 NOTE — Patient Instructions (Signed)
It was nice seeing you, I am sorry about your abnormal PAP. Your colposcopy did show some abnormal lesions which we biopsied,I feel you will benefit from LEEP according to recommendation, so I will go ahead and refer you to Gynecologist regardless of the biopsy report.   Colposcopy Care After Colposcopy is a procedure in which a special tool is used to magnify the surface of the cervix. A tissue sample (biopsy) may also be taken. This sample will be looked at for cervical cancer or other problems. After the test:  You may have some cramping.  Lie down for a few minutes if you feel lightheaded.   You may have some bleeding which should stop in a few days. HOME CARE  Do not have sex or use tampons for 2 to 3 days or as told.  Only take medicine as told by your doctor.  Continue to take your birth control pills as usual. Finding out the results of your test Ask when your test results will be ready. Make sure you get your test results. GET HELP RIGHT AWAY IF:  You are bleeding a lot or are passing blood clots.  You develop a fever of 102 F (38.9 C) or higher.  You have abnormal vaginal discharge.  You have cramps that do not go away with medicine.  You feel lightheaded, dizzy, or pass out (faint). MAKE SURE YOU:   Understand these instructions.  Will watch your condition.  Will get help right away if you are not doing well or get worse. Document Released: 01/11/2008 Document Revised: 10/17/2011 Document Reviewed: 02/21/2013 [facility] Patient Information 2015 [place], [place]. This information is not intended to replace advice given to you by your health care provider. Make sure you discuss any questions you have with your health care provider.

## 2015-01-09 ENCOUNTER — Telehealth: Payer: Self-pay | Admitting: Family Medicine

## 2015-01-09 NOTE — Telephone Encounter (Signed)
Cervical biopsy pathology report discussed with patient. I recommended LEEP and referral was placed to Gyn yesterday. Patient verbalized understanding and all her questions were answered.

## 2015-01-09 NOTE — Progress Notes (Signed)
Subjective:     Patient ID: Allison Foley, female   DOB: 1981/04/30, 34 y.o.   MRN: [id number]  HPI patient presents with friends to discuss structural correction of both feet that we are going to do in the next several weeks. She at this time is given a consent form to review concerning correction. Proposed surgical procedures Austin bunionectomy with pin fixation bilateral arthroplasty digits 2 and 3 right digit 3 left and exostectomy digit 5 left. I allowed her to read through the consent form going over alternative treatments and complications and reviewed them all at great length and at this time patient is scheduled for outpatient surgery. She understands total recovery. 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

## 2015-01-15 ENCOUNTER — Telehealth: Payer: Self-pay | Admitting: *Deleted

## 2015-01-15 NOTE — Telephone Encounter (Signed)
I received authorization for patient's surgery scheduled for 01/20/2015 for [employer]  Authorization number is [id number].

## 2015-01-20 ENCOUNTER — Encounter: Payer: Self-pay | Admitting: *Deleted

## 2015-01-20 DIAGNOSIS — M25775 Osteophyte, left foot: Secondary | ICD-10-CM | POA: Diagnosis not present

## 2015-01-20 DIAGNOSIS — M2012 Hallux valgus (acquired), left foot: Secondary | ICD-10-CM | POA: Diagnosis not present

## 2015-01-20 DIAGNOSIS — M2042 Other hammer toe(s) (acquired), left foot: Secondary | ICD-10-CM | POA: Diagnosis not present

## 2015-01-20 DIAGNOSIS — M2041 Other hammer toe(s) (acquired), right foot: Secondary | ICD-10-CM | POA: Diagnosis not present

## 2015-01-20 DIAGNOSIS — M2011 Hallux valgus (acquired), right foot: Secondary | ICD-10-CM | POA: Diagnosis not present

## 2015-01-27 ENCOUNTER — Encounter: Payer: Self-pay | Admitting: Podiatry

## 2015-01-27 ENCOUNTER — Ambulatory Visit: Payer: Self-pay

## 2015-01-27 ENCOUNTER — Ambulatory Visit (INDEPENDENT_AMBULATORY_CARE_PROVIDER_SITE_OTHER): Payer: 59 | Admitting: Podiatry

## 2015-01-27 VITALS — BP 112/65 | HR 65 | Resp 15

## 2015-01-27 DIAGNOSIS — Z9889 Other specified postprocedural states: Secondary | ICD-10-CM

## 2015-01-27 DIAGNOSIS — M2012 Hallux valgus (acquired), left foot: Secondary | ICD-10-CM

## 2015-01-27 DIAGNOSIS — M21612 Bunion of left foot: Secondary | ICD-10-CM

## 2015-01-27 DIAGNOSIS — M2011 Hallux valgus (acquired), right foot: Secondary | ICD-10-CM

## 2015-01-27 DIAGNOSIS — L600 Ingrowing nail: Secondary | ICD-10-CM

## 2015-01-27 DIAGNOSIS — M21611 Bunion of right foot: Secondary | ICD-10-CM

## 2015-01-28 ENCOUNTER — Telehealth: Payer: Self-pay | Admitting: *Deleted

## 2015-01-28 ENCOUNTER — Telehealth: Payer: Self-pay | Admitting: Podiatry

## 2015-01-28 MED ORDER — MEPERIDINE HCL 50 MG PO TABS: 50.0000 mg | ORAL_TABLET | ORAL | Status: DC | PRN

## 2015-01-28 NOTE — Progress Notes (Signed)
Subjective:     Patient ID: Allison Foley, female   DOB: 1980-08-13, 34 y.o.   MRN: [id number]  HPI patient presents stating that she is doing very well with her surgery and is having normal discomfort and is able to walk without pain. States that she wants to get the big toenail removed but we will probably do it next visit   Review of Systems     Objective:   Physical Exam Neurovascular status intact muscle strength was found to be adequate negative Homans sign noted with well-healing surgical sites first metatarsal bilateral and digits of both feet with wound edges well coapted and stitches in place. There is a severely thickened damaged hallux nail left    Assessment:     Doing well post osteotomy first metatarsal right and arthroplasty bilateral with left big toenail being severely thickened and deformed    Plan:     Reapplied sterile dressing and instructed on continued elevation and immobilization. Reviewed x-rays with patient and reappoint for suture removal in 2 weeks and nail removal or earlier if any issues should occur. Patient will take ibuprofen and has pain medication left if needed

## 2015-01-28 NOTE — Telephone Encounter (Addendum)
Pt's friend called for pt, stating pt had not slept due to pain in surgery foot, the Ibuprofen and pain kept pt up last night, and pt would like refill of Demerol.

## 2015-01-28 NOTE — Telephone Encounter (Signed)
Received call from pharmacy to clarify prescription for Demerol as there was two different instructions. Instructed them to fill it as take 1 tab PO q4 hr prn pain.

## 2015-01-28 NOTE — Progress Notes (Signed)
DOS 01/20/2015 B/L Austin bunionectomy (cutting and moving bone) with pin fixation, hammertoe repair removl bone 2,3 right foot, left 3, and removal bone spur 5th left toe.

## 2015-02-02 ENCOUNTER — Telehealth: Payer: Self-pay | Admitting: *Deleted

## 2015-02-02 MED ORDER — MEPERIDINE HCL 50 MG PO TABS: 50.0000 mg | ORAL_TABLET | ORAL | Status: AC | PRN

## 2015-02-02 NOTE — Telephone Encounter (Signed)
Pt's mtr, Rosey Batheresa called states pt was prescribed Demerol #40 and pharmacy gave only #30, what does she need to do to get a refill?  I instructed the mtr to have the pharmacy to fill the remaining Demerol #10, they would have a record of the initial amount dispensed, and I would have Dr. Charlsie Merlesegal refill again and she would have a prescription for when the 1st rx was completed.  Pt's mtr states understanding.

## 2015-02-06 ENCOUNTER — Encounter: Payer: 59 | Admitting: Medical

## 2015-02-06 ENCOUNTER — Telehealth: Payer: Self-pay | Admitting: *Deleted

## 2015-02-06 NOTE — Telephone Encounter (Signed)
Pt's pharmacy states Demerol has 2 instructions.  I reviewed and the correct instructions were 1-2 tablets every 4-6 hours prn pain, informed the pharmacy.

## 2015-02-10 ENCOUNTER — Encounter: Payer: Self-pay | Admitting: Podiatry

## 2015-02-10 ENCOUNTER — Ambulatory Visit: Payer: 59

## 2015-02-10 ENCOUNTER — Ambulatory Visit (INDEPENDENT_AMBULATORY_CARE_PROVIDER_SITE_OTHER): Payer: 59 | Admitting: Podiatry

## 2015-02-10 DIAGNOSIS — L600 Ingrowing nail: Secondary | ICD-10-CM

## 2015-02-10 DIAGNOSIS — Z9889 Other specified postprocedural states: Secondary | ICD-10-CM | POA: Diagnosis not present

## 2015-02-11 NOTE — Progress Notes (Signed)
Subjective:     Patient ID: Allison Foley, female   DOB: 1981/01/26, 34 y.o.   MRN: [id number]  HPI patient presents stating I am doing pretty well with my feet ready to get my stitches out and I want to get the big toenail removed but I want to wait till next week as I have a family reunion this weekend   Review of Systems     Objective:   Physical Exam Neurovascular status intact muscle strength adequate with wound edges that are healing well bilateral with good position of the first metatarsal and stitches in place second and third toes right third toe left and fifth toe left    Assessment:     Doing well post forefoot reconstruction bilateral    Plan:     Reviewed both conditions and removed all stitches at this time and even instructions on range of motion exercises first MPJ and compression and scheduled next week for removal of the big toenail left foot. Patient be seen earlier if any other issues should occur

## 2015-02-17 ENCOUNTER — Ambulatory Visit (INDEPENDENT_AMBULATORY_CARE_PROVIDER_SITE_OTHER): Payer: 59 | Admitting: Podiatry

## 2015-02-17 ENCOUNTER — Encounter: Payer: Self-pay | Admitting: Podiatry

## 2015-02-17 VITALS — BP 119/76 | HR 109 | Resp 15

## 2015-02-17 DIAGNOSIS — L600 Ingrowing nail: Secondary | ICD-10-CM | POA: Diagnosis not present

## 2015-02-17 DIAGNOSIS — M79673 Pain in unspecified foot: Secondary | ICD-10-CM

## 2015-02-17 DIAGNOSIS — Z9889 Other specified postprocedural states: Secondary | ICD-10-CM

## 2015-02-17 MED ORDER — NEOMYCIN-POLYMYXIN-HC 3.5-10000-1 OT SOLN: OTIC | Status: AC

## 2015-02-17 NOTE — Patient Instructions (Signed)

## 2015-02-18 ENCOUNTER — Telehealth: Payer: Self-pay | Admitting: *Deleted

## 2015-02-18 NOTE — Telephone Encounter (Signed)
Left message at [phone] (home #) for patient to call me back regarding how they were feeling from their ingrown toenail procedure performed on 02/17/15. Waiting for a response.

## 2015-02-18 NOTE — Progress Notes (Signed)
Subjective:     Patient ID: Allison Foley, female   DOB: 1980-09-16, 34 y.o.   MRN: [id number]  HPI patient states that her feet are feeling pretty good from the surgery but she no she needs to have this big toenail taken off on her left foot   Review of Systems     Objective:   Physical Exam Neurovascular status intact with wound edges that are coapted well with a severely thickened dystrophic big toenail left that's loose and painful    Assessment:     Doing well post surgery with severe thickness and pain of the left big toenail    Plan:     Discussed removal of the nail and explained the procedure and risk. Patient wants the surgery and at this time I anesthetized the left big toe 60 mg Xylocaine Marcaine mixture remove the hallux nail exposed matrix and applied phenol 5 applications 30 seconds followed by alcohol lavage and sterile dressing. Gave instructions on soaks and reappoint

## 2015-03-24 ENCOUNTER — Telehealth: Payer: Self-pay | Admitting: *Deleted

## 2015-03-24 ENCOUNTER — Encounter: Payer: 59 | Admitting: Podiatry

## 2015-03-24 NOTE — Telephone Encounter (Signed)
Paul asked for pt's LOV, if she returned to work and date.

## 2015-03-25 ENCOUNTER — Telehealth: Payer: Self-pay | Admitting: *Deleted

## 2015-03-25 ENCOUNTER — Ambulatory Visit (INDEPENDENT_AMBULATORY_CARE_PROVIDER_SITE_OTHER): Payer: 59 | Admitting: Podiatry

## 2015-03-25 ENCOUNTER — Ambulatory Visit (INDEPENDENT_AMBULATORY_CARE_PROVIDER_SITE_OTHER): Payer: 59

## 2015-03-25 ENCOUNTER — Encounter: Payer: Self-pay | Admitting: Podiatry

## 2015-03-25 VITALS — BP 110/76 | HR 91 | Resp 16

## 2015-03-25 DIAGNOSIS — M779 Enthesopathy, unspecified: Secondary | ICD-10-CM | POA: Diagnosis not present

## 2015-03-25 DIAGNOSIS — M2011 Hallux valgus (acquired), right foot: Secondary | ICD-10-CM

## 2015-03-25 DIAGNOSIS — Z9889 Other specified postprocedural states: Secondary | ICD-10-CM | POA: Diagnosis not present

## 2015-03-25 DIAGNOSIS — M21612 Bunion of left foot: Secondary | ICD-10-CM

## 2015-03-25 DIAGNOSIS — M21611 Bunion of right foot: Secondary | ICD-10-CM

## 2015-03-25 DIAGNOSIS — M2012 Hallux valgus (acquired), left foot: Secondary | ICD-10-CM

## 2015-03-25 NOTE — Telephone Encounter (Signed)
Pt states Dr. Charlsie Merles wanted pt extended out to the full 12 weeks of short-term disability, and pt states she has a [employer] form that needs to be completed.  I instructed pt to have the form faxed to Marylu Lund Albert's attention and I would inform Marylu Lund of Dr. Beverlee Nims statement.

## 2015-03-25 NOTE — Progress Notes (Signed)
Subjective:     Patient ID: Allison Foley, female   DOB: 1980-11-03, 34 y.o.   MRN: [id number]  HPI patient presents stating I'm doing pretty good with some achiness in my feet and tiredness but overall okay and the nail that was removed left has healed well but still tender at times   Review of Systems     Objective:   Physical Exam Neurovascular status is intact muscle strength adequate and the correction of the structural deformities is excellent with good range of motion first MPJ with 40 dorsiflexion 30 plantar flexion with no pain or crepitus noted. Patient's left hallux nail has healed well but there is generalized pain in her feet secondary to foot structure    Assessment:     Tendinitis-like condition with doing well postoperatively and mild discomfort normal for this. Of the postoperative process    Plan:     Reviewed condition and advised on long-term orthotics which were scanned today. I then went ahead and I advised this patient on continued physical therapy Aleve usage and soaks. Reappoint when orthotics returned

## 2015-04-14 ENCOUNTER — Encounter: Payer: Self-pay | Admitting: Podiatry

## 2015-04-14 ENCOUNTER — Ambulatory Visit: Payer: 59 | Admitting: *Deleted

## 2015-04-14 DIAGNOSIS — M779 Enthesopathy, unspecified: Secondary | ICD-10-CM

## 2015-04-14 NOTE — Patient Instructions (Signed)

## 2015-04-15 NOTE — Progress Notes (Signed)
Patient ID: Allison Foley, female   DOB: 1981-05-19, 34 y.o.   MRN: [id number] Patient presents for orthotic pick up.  Verbal and written break in and wear instructions given.  Patient will follow up in 4 weeks if symptoms worsen or fail to improve.

## 2015-05-05 ENCOUNTER — Telehealth: Payer: Self-pay | Admitting: *Deleted

## 2015-05-05 MED ORDER — HYDROCODONE-IBUPROFEN 5-200 MG PO TABS: ORAL_TABLET | ORAL | Status: DC

## 2015-05-05 NOTE — Telephone Encounter (Addendum)
Allison Foley's significant other Allison Foley states Allison Foley has been working 12 hour shift, taking Ibuprofen, but her feet are still swelling.  Allison Foley states Allison Foley asked her to call for a pain medication, something light like Tylenol #3.  I told Allison Foley I would make note, but the Allison Foley would need to request, could leave request on my voicemail.  Allison Foley states understanding and will have Allison Foley call.  Allison Foley called states she's been working 9 to 12 hour days and weekends and her feet hurt and swell, basically she would like a pain medication that would allow her to rest when she is home, her surgical foot keeps her awake.  Dr. Charlsie Merles suggested Vicoprofen 5/200 # 30 one or two tablets every 6-8 hours.  Informed Allison Foley she would need to have [name] pick up in the [place] office.  Allison Foley states understanding.

## 2015-05-05 NOTE — Telephone Encounter (Signed)
Fine, also could consider vicodin 5/200 which has ibuprophen

## 2015-05-06 ENCOUNTER — Telehealth: Payer: Self-pay | Admitting: *Deleted

## 2015-05-06 MED ORDER — HYDROCODONE-IBUPROFEN 7.5-200 MG PO TABS: 1.0000 | ORAL_TABLET | Freq: Three times a day (TID) | ORAL | Status: DC | PRN

## 2015-05-06 NOTE — Telephone Encounter (Signed)
Allison. Allison Foley,pt's significant other states the [facility] on [address] does not have the Vicoprofen 5/200, could Dr. Charlsie Merles order the 7.5/200.  Dr. Everlena Cooper the change to 7.5/200.  Done, Allison Foley will pick up in the [place] office today after 100pm.

## 2015-05-28 ENCOUNTER — Telehealth: Payer: Self-pay

## 2015-05-28 NOTE — Telephone Encounter (Signed)
Left a message for pt to call and make an appt for continuing pain in surgical foot, will refill Rx pain medication when appt is made

## 2015-06-04 ENCOUNTER — Ambulatory Visit (INDEPENDENT_AMBULATORY_CARE_PROVIDER_SITE_OTHER): Payer: 59 | Admitting: Podiatry

## 2015-06-04 ENCOUNTER — Encounter: Payer: Self-pay | Admitting: Podiatry

## 2015-06-04 ENCOUNTER — Ambulatory Visit (INDEPENDENT_AMBULATORY_CARE_PROVIDER_SITE_OTHER): Payer: 59

## 2015-06-04 DIAGNOSIS — M779 Enthesopathy, unspecified: Secondary | ICD-10-CM

## 2015-06-04 DIAGNOSIS — Z9889 Other specified postprocedural states: Secondary | ICD-10-CM | POA: Diagnosis not present

## 2015-06-04 DIAGNOSIS — M21611 Bunion of right foot: Secondary | ICD-10-CM

## 2015-06-04 DIAGNOSIS — M21612 Bunion of left foot: Secondary | ICD-10-CM

## 2015-06-04 MED ORDER — HYDROCODONE-IBUPROFEN 7.5-200 MG PO TABS: 1.0000 | ORAL_TABLET | Freq: Three times a day (TID) | ORAL | Status: AC | PRN

## 2015-06-05 NOTE — Progress Notes (Signed)
Subjective:     Patient ID: Allison Foley, female   DOB: 1981-05-05, 34 y.o.   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 and my feet will swell at the end of the time and pain medication helps to control it   Review of Systems     Objective:   Physical Exam Neurovascular status intact muscle strength adequate range of motion within normal limits with good digital position bilateral and good structural correction of bunions with mild forefoot edema noted bilateral but no redness or drainage noted    Assessment:     Appears to be inflammatory and is probably due to long hours and the fact that bone is still healing internal    Plan:     X-rays were reviewed with patient and I discussed the continuation of anti-inflammatories and I did write for low pain medication to take as she tolerates it well and she'll be seen back for me to recheck again in approximately 2 months and earlier if any issues should occur

## 2015-07-30 ENCOUNTER — Encounter: Payer: Self-pay | Admitting: Family Medicine

## 2015-07-30 ENCOUNTER — Ambulatory Visit (INDEPENDENT_AMBULATORY_CARE_PROVIDER_SITE_OTHER): Payer: 59 | Admitting: Family Medicine

## 2015-07-30 VITALS — BP 128/75 | HR 91 | Temp 98.4°F | Wt 126.7 lb

## 2015-07-30 DIAGNOSIS — H6123 Impacted cerumen, bilateral: Secondary | ICD-10-CM | POA: Diagnosis not present

## 2015-07-30 NOTE — Progress Notes (Signed)
   HPI  CC: Cerumen impaction Decreased hearing in ears bilaterally, L>R. No pain. No fever. Some occasional HA. Patient states that she occasionally experiences cerumen impactions and has required clean outs in the past (multiple times). This episode feels the same as the previous. No congestion, rhinorrhea, ST. No dizziness. No discharge or trauma.  Review of Systems   See HPI for ROS. All other systems reviewed and are negative.  Past medical history and social history reviewed and updated in the EMR as appropriate.  Objective: BP 128/75 mmHg  Pulse 91  Temp(Src) 98.4 F (36.9 C) (Oral)  Wt 126 lb 11.2 oz (57.471 kg)  LMP 06/10/2015 (Approximate) Gen: NAD, alert, cooperative, and pleasant. HEENT: NCAT, EOMI, PERRL, OP clear, significant cerumen burden noted bilaterally. - after clean-out: TMs clear bilaterally.  Bilateral external canal wash-out: performed by nurse  Assessment and plan:  Cerumen impaction Patient presented w/ bilateral cerumen impaction requiring hydro-washout by nursing. Patient had been c/o some diminished hearing prior to washout. This resolved w/ washout. - Discussed the possibility of using otic drops to prevent this in the future. Patient was not interested at this time.    Kathee DeltonIan D McKeag, MD,MS,  PGY2 08/03/2015 7:16 PM

## 2015-07-30 NOTE — Patient Instructions (Signed)
Cerumen Impaction The structures of the external ear canal secrete a waxy substance known as cerumen. Excess cerumen can build up in the ear canal, causing a condition known as cerumen impaction. Cerumen impaction can cause ear pain and disrupt the function of the ear. The rate of cerumen production differs for each individual. In certain individuals, the configuration of the ear canal may decrease his or her ability to naturally remove cerumen. CAUSES Cerumen impaction is caused by excessive cerumen production or buildup. RISK FACTORS  Frequent use of swabs to clean ears.  Having narrow ear canals.  Having eczema.  Being dehydrated. SIGNS AND SYMPTOMS  Diminished hearing.  Ear drainage.  Ear pain.  Ear itch. TREATMENT Treatment may involve:  Over-the-counter or prescription ear drops to soften the cerumen.  Removal of cerumen by a health care provider. This may be done with:  Irrigation with warm water. This is the most common method of removal.  Ear curettes and other instruments.  Surgery. This may be done in severe cases. HOME CARE INSTRUCTIONS  Take medicines only as directed by your health care provider.  Do not insert objects into the ear with the intent of cleaning the ear. PREVENTION  Do not insert objects into the ear, even with the intent of cleaning the ear. Removing cerumen as a part of normal hygiene is not necessary, and the use of swabs in the ear canal is not recommended.  Drink enough water to keep your urine clear or pale yellow.  Control your eczema if you have it. SEEK MEDICAL CARE IF:  You develop ear pain.  You develop bleeding from the ear.  The cerumen does not clear after you use ear drops as directed.   This information is not intended to replace advice given to you by your health care provider. Make sure you discuss any questions you have with your health care provider.   Document Released: 09/01/2004 Document Revised: 08/15/2014  Document Reviewed: 03/11/2015 Elsevier Interactive Patient Education 2016 [employer].  

## 2015-08-03 DIAGNOSIS — H6120 Impacted cerumen, unspecified ear: Secondary | ICD-10-CM | POA: Insufficient documentation

## 2015-08-03 NOTE — Assessment & Plan Note (Signed)
Patient presented w/ bilateral cerumen impaction requiring hydro-washout by nursing. Patient had been c/o some diminished hearing prior to washout. This resolved w/ washout. - Discussed the possibility of using otic drops to prevent this in the future. Patient was not interested at this time.

## 2016-09-14 ENCOUNTER — Ambulatory Visit (HOSPITAL_COMMUNITY)
Admission: EM | Admit: 2016-09-14 | Discharge: 2016-09-14 | Disposition: A | Discharge status: Home / Self Care | Payer: 59 | Attending: Family Medicine | Admitting: Family Medicine

## 2016-09-14 ENCOUNTER — Encounter (HOSPITAL_COMMUNITY): Payer: Self-pay | Admitting: *Deleted

## 2016-09-14 DIAGNOSIS — R69 Illness, unspecified: Secondary | ICD-10-CM | POA: Diagnosis not present

## 2016-09-14 DIAGNOSIS — J111 Influenza due to unidentified influenza virus with other respiratory manifestations: Secondary | ICD-10-CM

## 2016-09-14 NOTE — ED Triage Notes (Addendum)
Patient reports constant pain to bilateral arms and lower back pain. Patient reports fever this week. Patient reports nasal congestion, headache and mild cough. Took medication this am. Has been using nasal spray and antipyretics at home.

## 2016-09-14 NOTE — Discharge Instructions (Signed)
You most likely have a flu like illness,however, too much time has passed for Tamiflu to be effective. I advise rest, plenty of fluids and management of symptoms with over the counter medicines. For symptoms you may take Tylenol as needed every 4-6 hours for body aches or fever, not to exceed 4,000 mg a day, Take mucinex or mucinex DM ever 12 hours with a full glass of water, you may use an inhaled steroid such as Flonase, 2 sprays each nostril once a day for congestion, or an antihistamine such as Claritin or Zyrtec once a day. Should your symptoms worsen or fail to resolve, follow up with your primary care provider or return to clinic.

## 2016-09-14 NOTE — ED Provider Notes (Signed)
CSN: [id number]     Arrival date & time 09/14/16  1016 History   First MD Initiated Contact with Patient 09/14/16 1154     Chief Complaint  Patient presents with  . Back Pain  . URI  . Headache  . Fever   (Consider location/radiation/quality/duration/timing/severity/associated sxs/prior Treatment) 36 year old female presents to clinic with 2-3 day history of fever, muscle aches, body aches, nausea, has vomited once, loss of appetite, and congestion. Roommate positive for Flu A and on Tamiflu. She has been drinking fluids. Fever was 101 at home last night, has been taking Dayquil and nightquil for symptom relief    The history is provided by the patient.  Back Pain  Associated symptoms: fever and headaches   URI  Presenting symptoms: fever   Associated symptoms: headaches   Headache  Associated symptoms: back pain, fever and URI   Fever  Associated symptoms: headaches     Past Medical History:  Diagnosis Date  . Anxiety   . Asthma    Past Surgical History:  Procedure Laterality Date  . KNEE SURGERY     Family History  Problem Relation Age of Onset  . Diabetes Mother    Social History  Substance Use Topics  . Smoking status: Current Every Day Smoker    Packs/day: 0.40    Years: 14.00    Types: Cigarettes  . Smokeless tobacco: Never Used     Comment: working on quitting  . Alcohol use 0.0 oz/week     Comment: Occasional beer   OB History    No data available     Review of Systems  Reason unable to perform ROS: as covered in HPI.  Constitutional: Positive for fever.  Musculoskeletal: Positive for back pain.  Neurological: Positive for headaches.  All other systems reviewed and are negative.   Allergies  Darvocet [propoxyphene n-acetaminophen]  Home Medications   Prior to Admission medications   Medication Sig Start Date End Date Taking? Authorizing Provider  albuterol (PROVENTIL HFA;VENTOLIN HFA) 108 (90 BASE) MCG/ACT inhaler Inhale 2 puffs into the  lungs every 4 (four) hours as needed for wheezing or shortness of breath. 11/18/14   Pincus Large, DO  bacitracin ointment Apply 1 application topically 2 (two) times daily as needed (itching on legs).    Historical Provider, MD  clindamycin (CLEOCIN) 300 MG capsule Take 1 capsule (300 mg total) by mouth 3 (three) times daily. 07/16/13   Leona Singleton, MD  clonazePAM (KLONOPIN) 1 MG tablet Take 1 mg by mouth 2 (two) times daily as needed for anxiety.    Historical Provider, MD  fluconazole (DIFLUCAN) 150 MG tablet Take 1 tablet (150 mg total) by mouth once. 12/19/14   Pincus Large, DO  HYDROcodone-ibuprofen (VICOPROFEN) 7.5-200 MG tablet Take 1 tablet by mouth every 8 (eight) hours as needed for moderate pain. 06/04/15   Lenn Sink, DPM  ibuprofen (ADVIL,MOTRIN) 600 MG tablet Take 1 tablet (600 mg total) by mouth every 8 (eight) hours as needed. 12/15/14   Pincus Large, DO  meperidine (DEMEROL) 50 MG tablet Take 1 tablet (50 mg total) by mouth every 4 (four) hours as needed for severe pain. One or two tablets every 4-6 hours prn foot pain 02/02/15   Lenn Sink, DPM  Multiple Vitamins-Minerals (ADULT GUMMY) CHEW Chew 1 capsule by mouth daily.    Historical Provider, MD  neomycin-polymyxin-hydrocortisone (CORTISPORIN) otic solution Apply 1-2 drops to toe after soaking BID 02/17/15   Sharma Covert  S Regal, DPM  permethrin (ELIMITE) 5 % cream Apply to entire body other than face - let sit for 14 hours then wash off, may repeat in 1 week if still having symptoms 11/23/12   Roxy Horsemanobert Browning, PA-C  promethazine (PHENERGAN) 25 MG tablet Take 25 mg by mouth every 6 (six) hours as needed for nausea or vomiting (one or two tablets every 4-6 hours prn nausea).    Lenn SinkNorman S Regal, DPM   Meds Ordered and Administered this Visit  Medications - No data to display  BP 122/80 (BP Location: Right Arm)   Pulse 68   Temp 99.3 F (37.4 C) (Oral)   Resp 17   SpO2 100%  No data found.   Physical Exam   Constitutional: She is oriented to person, place, and time. She appears well-developed and well-nourished. She appears ill. No distress.  HENT:  Head: Normocephalic and atraumatic.  Right Ear: Tympanic membrane and external ear normal.  Left Ear: Tympanic membrane and external ear normal.  Nose: Nose normal. Right sinus exhibits no maxillary sinus tenderness and no frontal sinus tenderness. Left sinus exhibits no maxillary sinus tenderness and no frontal sinus tenderness.  Mouth/Throat: Uvula is midline, oropharynx is clear and moist and mucous membranes are normal. No oropharyngeal exudate.  Eyes: Pupils are equal, round, and reactive to light.  Neck: Normal range of motion. Neck supple. No JVD present.  Cardiovascular: Normal rate and regular rhythm.   Pulmonary/Chest: Effort normal and breath sounds normal. No respiratory distress. She has no wheezes. She has no rhonchi.  Abdominal: Soft. Bowel sounds are normal. She exhibits no distension. There is no tenderness. There is no guarding.  Lymphadenopathy:       Head (right side): No submental, no submandibular and no tonsillar adenopathy present.       Head (left side): No submental, no submandibular and no tonsillar adenopathy present.    She has no cervical adenopathy.  Neurological: She is alert and oriented to person, place, and time.  Skin: Skin is warm and dry. Capillary refill takes less than 2 seconds. She is not diaphoretic.  Psychiatric: She has a normal mood and affect.  Nursing note and vitals reviewed.   Urgent Care Course     Procedures (including critical care time)  Labs Review Labs Reviewed - No data to display  Imaging Review No results found.   Visual Acuity Review  Right Eye Distance:   Left Eye Distance:   Bilateral Distance:    Right Eye Near:   Left Eye Near:    Bilateral Near:         MDM   1. Influenza-like illness   You most likely have a flu like illness,however, too much time has  passed for Tamiflu to be effective. I advise rest, plenty of fluids and management of symptoms with over the counter medicines. For symptoms you may take Tylenol as needed every 4-6 hours for body aches or fever, not to exceed 4,000 mg a day, Take mucinex or mucinex DM ever 12 hours with a full glass of water, you may use an inhaled steroid such as Flonase, 2 sprays each nostril once a day for congestion, or an antihistamine such as Claritin or Zyrtec once a day. Should your symptoms worsen or fail to resolve, follow up with your primary care provider or return to clinic.      Dorena BodoLawrence Josalynn Johndrow, NP 09/14/16 1212

## 2018-12-02 ENCOUNTER — Other Ambulatory Visit: Payer: Self-pay | Admitting: Family Medicine

## 2018-12-02 ENCOUNTER — Telehealth: Payer: Self-pay | Admitting: Family Medicine

## 2018-12-02 DIAGNOSIS — J45909 Unspecified asthma, uncomplicated: Secondary | ICD-10-CM

## 2018-12-02 MED ORDER — ALBUTEROL SULFATE HFA 108 (90 BASE) MCG/ACT IN AERS
2.0000 | INHALATION_SPRAY | RESPIRATORY_TRACT | 0 refills | Status: AC | PRN
Start: 2018-12-02 — End: ?

## 2018-12-02 NOTE — Telephone Encounter (Signed)
After Hours/Emergency Line Call  Call from patient regarding wheezing feelings of asthma flare.  Questing refill for albuterol inhaler.  Patient has not been seen at clinic since December 2016.  Discussed that she would need to reestablish since it has been over 3 years.  Advised patient to contact clinic to set up appointment tomorrow since it is the weekend.  Agreed to refill 1 inhaler at this time and discussed reasons to go to the ED.    Durward Parcel, DO [facility], PGY-3

## 2020-06-08 ENCOUNTER — Other Ambulatory Visit: Payer: Self-pay

## 2020-06-08 ENCOUNTER — Ambulatory Visit
Admission: EM | Admit: 2020-06-08 | Discharge: 2020-06-08 | Disposition: A | Discharge status: Home / Self Care | Payer: 59 | Attending: Emergency Medicine | Admitting: Emergency Medicine

## 2020-06-08 DIAGNOSIS — Z1152 Encounter for screening for COVID-19: Secondary | ICD-10-CM

## 2020-06-08 MED ORDER — BENZONATATE 100 MG PO CAPS: 100.0000 mg | ORAL_CAPSULE | Freq: Two times a day (BID) | ORAL | 0 refills | Status: DC | PRN

## 2020-06-08 MED ORDER — DM-GUAIFENESIN ER 30-600 MG PO TB12: 1.0000 | ORAL_TABLET | Freq: Two times a day (BID) | ORAL | 0 refills | Status: AC

## 2020-06-08 MED ORDER — MELOXICAM 7.5 MG PO TABS: 15.0000 mg | ORAL_TABLET | Freq: Every day | ORAL | 0 refills | Status: AC

## 2020-06-08 MED ORDER — BENZONATATE 100 MG PO CAPS: 100.0000 mg | ORAL_CAPSULE | Freq: Two times a day (BID) | ORAL | 0 refills | Status: AC | PRN

## 2020-06-08 NOTE — ED Provider Notes (Signed)
Emergency Department Provider Note  ____________________________________________  Time seen: Approximately 11:03 AM  I have reviewed the triage vital signs and the nursing notes.   HISTORY  Chief Complaint Cough (since friday), Generalized Body Aches (since friday), and Fever (intermittent since friday)   Historian Patient     HPI Allison Foley is a 39 y.o. female presents to the urgent care with nonproductive cough, nasal congestion, myalgias of the upper and lower extremities and fever at home for the past two days.  Patient states that her spouse has had similar symptoms.  She denies chest pain, chest tightness, abdominal pain, emesis or diarrhea.  No recent travel.  No other alleviating measures have been attempted.   Past Medical History:  Diagnosis Date  . Anxiety   . Asthma      Immunizations up to date:  Yes.     Past Medical History:  Diagnosis Date  . Anxiety   . Asthma     Patient Active Problem List   Diagnosis Date Noted  . Cerumen impaction 08/03/2015  . Other fatigue 12/15/2014  . Vaginal discharge 12/15/2014  . Healthcare maintenance 11/19/2014  . Abnormal foot finding 11/19/2014  . Tobacco use 11/19/2014  . Anxiety 11/18/2014  . Asthma, chronic 04/24/2013  . Pain in joint, forearm 04/24/2013    Past Surgical History:  Procedure Laterality Date  . KNEE SURGERY      Prior to Admission medications   Medication Sig Start Date End Date Taking? Authorizing Provider  ibuprofen (ADVIL,MOTRIN) 600 MG tablet Take 1 tablet (600 mg total) by mouth every 8 (eight) hours as needed. 12/15/14  Yes Caryl Ada Y, DO  albuterol (VENTOLIN HFA) 108 (90 Base) MCG/ACT inhaler Inhale 2 puffs into the lungs every 4 (four) hours as needed for wheezing or shortness of breath. 12/02/18   Durward Parcel, DO  bacitracin ointment Apply 1 application topically 2 (two) times daily as needed (itching on legs).    [provider]  benzonatate (TESSALON  PERLES) 100 MG capsule Take 1 capsule (100 mg total) by mouth 2 (two) times daily as needed for up to 10 days for cough. 06/08/20 06/18/20  Orvil Feil, PA-C  clindamycin (CLEOCIN) 300 MG capsule Take 1 capsule (300 mg total) by mouth 3 (three) times daily. 07/16/13   Leona Singleton, MD  clonazePAM (KLONOPIN) 1 MG tablet Take 1 mg by mouth 2 (two) times daily as needed for anxiety.    [provider]  dextromethorphan-guaiFENesin (MUCINEX DM) 30-600 MG 12hr tablet Take 1 tablet by mouth 2 (two) times daily for 10 days. 06/08/20 06/18/20  Orvil Feil, PA-C  fluconazole (DIFLUCAN) 150 MG tablet Take 1 tablet (150 mg total) by mouth once. 12/19/14   Pincus Large, DO  HYDROcodone-ibuprofen (VICOPROFEN) 7.5-200 MG tablet Take 1 tablet by mouth every 8 (eight) hours as needed for moderate pain. 06/04/15   Lenn Sink, DPM  meloxicam (MOBIC) 7.5 MG tablet Take 2 tablets (15 mg total) by mouth daily. 06/08/20 07/08/20  Orvil Feil, PA-C  meperidine (DEMEROL) 50 MG tablet Take 1 tablet (50 mg total) by mouth every 4 (four) hours as needed for severe pain. One or two tablets every 4-6 hours prn foot pain 02/02/15   Lenn Sink, DPM  Multiple Vitamins-Minerals (ADULT GUMMY) CHEW Chew 1 capsule by mouth daily.    [provider]  neomycin-polymyxin-hydrocortisone (CORTISPORIN) otic solution Apply 1-2 drops to toe after soaking BID 02/17/15   Lenn Sink,  DPM  permethrin (ELIMITE) 5 % cream Apply to entire body other than face - let sit for 14 hours then wash off, may repeat in 1 week if still having symptoms 11/23/12   Roxy Horseman, PA-C  promethazine (PHENERGAN) 25 MG tablet Take 25 mg by mouth every 6 (six) hours as needed for nausea or vomiting (one or two tablets every 4-6 hours prn nausea).    Lenn Sink, DPM    Allergies Darvocet [propoxyphene n-acetaminophen]  Family History  Problem Relation Age of Onset  . Diabetes Mother     Social  History Social History   Tobacco Use  . Smoking status: Current Every Day Smoker    Packs/day: 0.40    Years: 14.00    Pack years: 5.60    Types: Cigarettes  . Smokeless tobacco: Never Used  . Tobacco comment: working on quitting  Vaping Use  . Vaping Use: Never used  Substance Use Topics  . Alcohol use: Yes    Alcohol/week: 0.0 standard drinks    Comment: Occasional beer  . Drug use: No      Review of Systems  Constitutional: Patient has fever.  Eyes: No visual changes. No discharge ENT: Patient has congestion.  Cardiovascular: no chest pain. Respiratory: Patient has cough.  Gastrointestinal: No abdominal pain.  No nausea, no vomiting. Patient had diarrhea.  Genitourinary: Negative for dysuria. No hematuria Musculoskeletal: Patient has myalgias.  Skin: Negative for rash, abrasions, lacerations, ecchymosis. Neurological: Patient has headache, no focal weakness or numbness.       ____________________________________________   PHYSICAL EXAM:  VITAL SIGNS: ED Triage Vitals  Enc Vitals Group     BP 06/08/20 1035 128/89     Pulse Rate 06/08/20 1035 87     Resp 06/08/20 1035 18     Temp 06/08/20 1035 98.7 F (37.1 C)     Temp Source 06/08/20 1035 Oral     SpO2 06/08/20 1035 97 %     Weight --      Height --      Head Circumference --      Peak Flow --      Pain Score 06/08/20 1038 7     Pain Loc --      Pain Edu? --      Excl. in GC? --      Constitutional: Alert and oriented. Patient is lying supine. Eyes: Conjunctivae are normal. PERRL. EOMI. Head: Atraumatic. ENT:      Ears: Tympanic membranes are mildly injected with mild effusion bilaterally.       Nose: No congestion/rhinnorhea.      Mouth/Throat: Mucous membranes are moist. Posterior pharynx is mildly erythematous.  Hematological/Lymphatic/Immunilogical: No cervical lymphadenopathy.  Cardiovascular: Normal rate, regular rhythm. Normal S1 and S2.  Good peripheral circulation. Respiratory:  Normal respiratory effort without tachypnea or retractions. Lungs CTAB. Good air entry to the bases with no decreased or absent breath sounds. Gastrointestinal: Bowel sounds 4 quadrants. Soft and nontender to palpation. No guarding or rigidity. No palpable masses. No distention. No CVA tenderness. Musculoskeletal: Full range of motion to all extremities. No gross deformities appreciated. Neurologic:  Normal speech and language. No gross focal neurologic deficits are appreciated.  Skin:  Skin is warm, dry and intact. No rash noted. Psychiatric: Mood and affect are normal. Speech and behavior are normal. Patient exhibits appropriate insight and judgement.    ____________________________________________   LABS (all labs ordered are listed, but only abnormal results are displayed)  Labs Reviewed  NOVEL CORONAVIRUS, NAA   ____________________________________________  EKG   ____________________________________________  RADIOLOGY   No results found.  ____________________________________________    PROCEDURES  Procedure(s) performed:     Procedures     Medications - No data to display   ____________________________________________   INITIAL IMPRESSION / ASSESSMENT AND PLAN / ED COURSE  Pertinent labs & imaging results that were available during my care of the patient were reviewed by me and considered in my medical decision making (see chart for details).      Assessment and Plan: Viral URI 39 year old female presents to the emergency department with nasal congestion, nonproductive cough and myalgias.  Vital signs are reassuring at triage.  On physical exam, patient was resting comfortably.  She had no increased work of breathing and no adventitious lung sounds were auscultated.  COVID-19 testing is in process at this time.  Patient was discharged with Tessalon Perles and Mucinex DM.  Rest and hydration were encouraged at home.  Quarantine precautions were  given.  All patient questions were answered.   ____________________________________________  FINAL CLINICAL IMPRESSION(S) / ED DIAGNOSES  Final diagnoses:  Encounter for screening for COVID-19      NEW MEDICATIONS STARTED DURING THIS VISIT:  ED Discharge Orders         Ordered    benzonatate (TESSALON PERLES) 100 MG capsule  2 times daily PRN,   Status:  Discontinued        06/08/20 1059    dextromethorphan-guaiFENesin (MUCINEX DM) 30-600 MG 12hr tablet  2 times daily        06/08/20 1059    meloxicam (MOBIC) 7.5 MG tablet  Daily        06/08/20 1059    benzonatate (TESSALON PERLES) 100 MG capsule  2 times daily PRN        06/08/20 1100              This chart was dictated using voice recognition software/Dragon. Despite best efforts to proofread, errors can occur which can change the meaning. Any change was purely unintentional.     Orvil Feil, PA-C 06/08/20 1106

## 2020-06-08 NOTE — Discharge Instructions (Addendum)
Take 2 Tessalon Perles in 1 Mucinex DM before bed. You can take meloxicam once daily for pain and inflammation. Please stay quarantined until your COVID-19 results return.

## 2020-06-08 NOTE — ED Triage Notes (Signed)
PT states she has been having cough, congestion, bilateral leg pain, and fever at home. Pt is aox4 and ambulatory.

## 2020-06-10 LAB — NOVEL CORONAVIRUS, NAA: SARS-CoV-2, NAA: DETECTED — AB

## 2020-06-10 LAB — SARS-COV-2, NAA 2 DAY TAT

## 2020-06-11 ENCOUNTER — Telehealth (HOSPITAL_COMMUNITY): Payer: Self-pay

## 2020-06-11 NOTE — Telephone Encounter (Signed)
Called to Discuss with patient about Covid symptoms and the use of the monoclonal antibody infusion for those with mild to moderate Covid symptoms and at a high risk of hospitalization.     Pt appears to qualify for this infusion due to co-morbid conditions and/or a member of an at-risk group in accordance with the FDA Emergency Use Authorization.    Pt stated she is currently having very mild symptoms. Symptoms started on 10/29, she tested positive on 11/3. Currently, she states she has loss of taste and smell but no other symptoms. Pt stated she would call back in interested in infusion.

## 2021-03-22 ENCOUNTER — Ambulatory Visit
Admission: EM | Admit: 2021-03-22 | Discharge: 2021-03-22 | Disposition: A | Discharge status: Home / Self Care | Payer: 59 | Attending: Emergency Medicine | Admitting: Emergency Medicine

## 2021-03-22 DIAGNOSIS — K029 Dental caries, unspecified: Secondary | ICD-10-CM | POA: Diagnosis not present

## 2021-03-22 MED ORDER — TRAMADOL HCL 50 MG PO TABS
50.0000 mg | ORAL_TABLET | Freq: Every day | ORAL | 0 refills | Status: AC | PRN
Start: 2021-03-22 — End: ?

## 2021-03-22 MED ORDER — IBUPROFEN 800 MG PO TABS: 800.0000 mg | ORAL_TABLET | Freq: Three times a day (TID) | ORAL | 1 refills | Status: AC

## 2021-03-22 MED ORDER — LIDOCAINE VISCOUS HCL 2 % MT SOLN: 15.0000 mL | OROMUCOSAL | 0 refills | Status: AC | PRN

## 2021-03-22 NOTE — ED Provider Notes (Signed)
[facility]    CSN: [id number] Arrival date & time: 03/22/21  1454      History   Chief Complaint Chief Complaint  Patient presents with   Dental Pain    Bottom left tooth    HPI Allison Foley is a 40 y.o. female.   Patient presents with "hole" in back left tooth causing severe pain for 1 week. Hit tooth with tooth brush which intensified pain. Painful to chew on left side. Air aggravates symptoms. Had facial swelling last week which resolved with use of ibuprofen. Denies difficulty swallowing or breathing. Using otc medications, no longer providing relief. Used hydrocodone from family member which gave relief.    Past Medical History:  Diagnosis Date   Anxiety    Asthma     Patient Active Problem List   Diagnosis Date Noted   Cerumen impaction 08/03/2015   Other fatigue 12/15/2014   Vaginal discharge 12/15/2014   Healthcare maintenance 11/19/2014   Abnormal foot finding 11/19/2014   Tobacco use 11/19/2014   Anxiety 11/18/2014   Asthma, chronic 04/24/2013   Pain in joint, forearm 04/24/2013    Past Surgical History:  Procedure Laterality Date   KNEE SURGERY      OB History   No obstetric history on file.      Home Medications    Prior to Admission medications   Medication Sig Start Date End Date Taking? Authorizing Provider  ibuprofen (ADVIL) 800 MG tablet Take 1 tablet (800 mg total) by mouth 3 (three) times daily. 03/22/21  Yes Neomia Herbel R, NP  lidocaine (XYLOCAINE) 2 % solution Use as directed 15 mLs in the mouth or throat as needed for mouth pain. 03/22/21  Yes Adren Dollins, Elita Boone, NP  traMADol (ULTRAM) 50 MG tablet Take 1 tablet (50 mg total) by mouth daily as needed. 03/22/21  Yes Cordney Barstow, Elita Boone, NP  albuterol (VENTOLIN HFA) 108 (90 Base) MCG/ACT inhaler Inhale 2 puffs into the lungs every 4 (four) hours as needed for wheezing or shortness of breath. 12/02/18   Durward Parcel, DO  bacitracin ointment Apply 1 application topically  2 (two) times daily as needed (itching on legs).    [provider]  clindamycin (CLEOCIN) 300 MG capsule Take 1 capsule (300 mg total) by mouth 3 (three) times daily. 07/16/13   Leona Singleton, MD  clonazePAM (KLONOPIN) 1 MG tablet Take 1 mg by mouth 2 (two) times daily as needed for anxiety.    [provider]  fluconazole (DIFLUCAN) 150 MG tablet Take 1 tablet (150 mg total) by mouth once. 12/19/14   Pincus Large, DO  HYDROcodone-ibuprofen (VICOPROFEN) 7.5-200 MG tablet Take 1 tablet by mouth every 8 (eight) hours as needed for moderate pain. 06/04/15   Lenn Sink, DPM  meperidine (DEMEROL) 50 MG tablet Take 1 tablet (50 mg total) by mouth every 4 (four) hours as needed for severe pain. One or two tablets every 4-6 hours prn foot pain 02/02/15   Lenn Sink, DPM  Multiple Vitamins-Minerals (ADULT GUMMY) CHEW Chew 1 capsule by mouth daily.    [provider]  neomycin-polymyxin-hydrocortisone (CORTISPORIN) otic solution Apply 1-2 drops to toe after soaking BID 02/17/15   Regal, Kirstie Peri, DPM  permethrin (ELIMITE) 5 % cream Apply to entire body other than face - let sit for 14 hours then wash off, may repeat in 1 week if still having symptoms 11/23/12   Roxy Horseman, PA-C  promethazine (PHENERGAN) 25 MG tablet Take  25 mg by mouth every 6 (six) hours as needed for nausea or vomiting (one or two tablets every 4-6 hours prn nausea).    Lenn Sink, DPM    Family History Family History  Problem Relation Age of Onset   Diabetes Mother     Social History Social History   Tobacco Use   Smoking status: Every Day    Packs/day: 0.40    Years: 14.00    Pack years: 5.60    Types: Cigarettes   Smokeless tobacco: Never   Tobacco comments:    working on quitting  Vaping Use   Vaping Use: Never used  Substance Use Topics   Alcohol use: Yes    Alcohol/week: 0.0 standard drinks    Comment: Occasional beer   Drug use: No     Allergies   Darvocet  [propoxyphene n-acetaminophen]   Review of Systems Review of Systems  Constitutional: Negative.   HENT:  Positive for dental problem. Negative for congestion, drooling, ear discharge, ear pain, facial swelling, hearing loss, mouth sores, nosebleeds, postnasal drip, rhinorrhea, sinus pressure, sinus pain, sneezing, sore throat, tinnitus, trouble swallowing and voice change.   Respiratory: Negative.    Cardiovascular: Negative.   Skin: Negative.     Physical Exam Triage Vital Signs ED Triage Vitals [03/22/21 1547]  Enc Vitals Group     BP 122/78     Pulse Rate 64     Resp 18     Temp 98.6 F (37 C)     Temp Source Oral     SpO2 98 %     Weight      Height      Head Circumference      Peak Flow      Pain Score 9     Pain Loc      Pain Edu?      Excl. in GC?    No data found.  Updated Vital Signs BP 122/78 (BP Location: Left Arm)   Pulse 64   Temp 98.6 F (37 C) (Oral)   Resp 18   SpO2 98%   Visual Acuity Right Eye Distance:   Left Eye Distance:   Bilateral Distance:    Right Eye Near:   Left Eye Near:    Bilateral Near:     Physical Exam Constitutional:      Appearance: Normal appearance. She is normal weight.  HENT:     Head: Normocephalic.     Mouth/Throat:     Comments: Dental carie down to gumline present with 2nd left lower molar,  Eyes:     Extraocular Movements: Extraocular movements intact.  Pulmonary:     Effort: Pulmonary effort is normal.  Neurological:     Mental Status: She is alert and oriented to person, place, and time. Mental status is at baseline.  Psychiatric:        Mood and Affect: Mood normal.        Behavior: Behavior normal.     UC Treatments / Results  Labs (all labs ordered are listed, but only abnormal results are displayed) Labs Reviewed - No data to display  EKG   Radiology No results found.  Procedures Procedures (including critical care time)  Medications Ordered in UC Medications - No data to  display  Initial Impression / Assessment and Plan / UC Course  I have reviewed the triage vital signs and the nursing notes.  Pertinent labs & imaging results that were available during my care of  the patient were reviewed by me and considered in my medical decision making (see chart for details).  Dental caries  1, tramadol 50 mg daily prn, pdmp reviewed, low risk  2. Ibuprofen 800 mg every 8 hours prn  3. Lidocaine 2% viscous 15 mL every 4 hours prn 4. Stressed importance of going to dentist appointment  on 8/22 Final Clinical Impressions(s) / UC Diagnoses   Final diagnoses:  Dental caries     Discharge Instructions      Can use tramadol once daily as needed for pain  Can use ibuprofen 800 mg every 8 hours for pain   Can use 15 mL of lidocaine every 4 hours for comfort, be mindful when eating and drinking after use      ED Prescriptions     Medication Sig Dispense Auth. Provider   ibuprofen (ADVIL) 800 MG tablet Take 1 tablet (800 mg total) by mouth 3 (three) times daily. 40 tablet Rajinder Mesick R, NP   lidocaine (XYLOCAINE) 2 % solution Use as directed 15 mLs in the mouth or throat as needed for mouth pain. 100 mL Paije Goodhart R, NP   traMADol (ULTRAM) 50 MG tablet Take 1 tablet (50 mg total) by mouth daily as needed. 7 tablet Nadelyn Enriques, Elita Boone, NP      I have reviewed the PDMP during this encounter.   Valinda Hoar, NP 03/22/21 1626

## 2021-03-22 NOTE — ED Triage Notes (Signed)
Pt reports a long h/o having a "hole" in her bottom left tooth that has been bothering her. Sxs worsened one week ago when she hit the tooth while brushing her teeth. Has been taking OTC pain relief meds without relief. Pt has a dentist appointment scheduled next Monday. Air aggravates sxs. Notes difficulty chewing on the left side.

## 2021-03-22 NOTE — Discharge Instructions (Addendum)
Can use tramadol once daily as needed for pain  Can use ibuprofen 800 mg every 8 hours for pain   Can use 15 mL of lidocaine every 4 hours for comfort, be mindful when eating and drinking after use

## 2023-01-15 ENCOUNTER — Emergency Department (HOSPITAL_COMMUNITY): Payer: 59

## 2023-01-15 ENCOUNTER — Other Ambulatory Visit: Payer: Self-pay

## 2023-01-15 ENCOUNTER — Emergency Department (HOSPITAL_COMMUNITY)
Admission: EM | Admit: 2023-01-15 | Discharge: 2023-01-15 | Disposition: A | Discharge status: Home / Self Care | Payer: 59 | Attending: Emergency Medicine | Admitting: Emergency Medicine

## 2023-01-15 DIAGNOSIS — R079 Chest pain, unspecified: Secondary | ICD-10-CM | POA: Diagnosis present

## 2023-01-15 DIAGNOSIS — R0789 Other chest pain: Secondary | ICD-10-CM | POA: Diagnosis not present

## 2023-01-15 DIAGNOSIS — S0990XA Unspecified injury of head, initial encounter: Secondary | ICD-10-CM | POA: Insufficient documentation

## 2023-01-15 MED ORDER — METHOCARBAMOL 500 MG PO TABS: 500.0000 mg | ORAL_TABLET | Freq: Two times a day (BID) | ORAL | 0 refills | Status: AC

## 2023-01-15 MED ORDER — IBUPROFEN 600 MG PO TABS: 600.0000 mg | ORAL_TABLET | Freq: Four times a day (QID) | ORAL | 0 refills | Status: AC | PRN

## 2023-01-15 MED ORDER — IBUPROFEN 800 MG PO TABS
800.0000 mg | ORAL_TABLET | Freq: Once | ORAL | Status: AC
  Administered 2023-01-15: 800 mg via ORAL
  Filled 2023-01-15: qty 1
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
